# Patient Record
Sex: Male | Born: 1945 | Race: White | Hispanic: No | Smoking: Former smoker
Health system: Southern US, Community
[De-identification: ages and names within clinical notes are randomized; demographics above are authoritative.]

## PROBLEM LIST (undated history)

## (undated) DIAGNOSIS — M1990 Unspecified osteoarthritis, unspecified site: Secondary | ICD-10-CM

## (undated) DIAGNOSIS — E039 Hypothyroidism, unspecified: Secondary | ICD-10-CM

## (undated) DIAGNOSIS — E079 Disorder of thyroid, unspecified: Secondary | ICD-10-CM

## (undated) DIAGNOSIS — I10 Essential (primary) hypertension: Secondary | ICD-10-CM

## (undated) HISTORY — PX: TONSILLECTOMY: SUR1361

## (undated) HISTORY — PX: FEMUR FRACTURE SURGERY: SHX633

## (undated) HISTORY — PX: EYE SURGERY: SHX253

## (undated) HISTORY — PX: HERNIA REPAIR: SHX51

## (undated) HISTORY — PX: POLYPECTOMY: SHX149

## (undated) HISTORY — PX: OTHER SURGICAL HISTORY: SHX169

## (undated) SURGERY — COLONOSCOPY WITH PROPOFOL
Anesthesia: Monitor Anesthesia Care

---

## 2011-10-25 ENCOUNTER — Emergency Department (HOSPITAL_COMMUNITY)
Admission: EM | Admit: 2011-10-25 | Discharge: 2011-10-25 | Disposition: A | Discharge status: Home / Self Care | Payer: Non-veteran care | Attending: Emergency Medicine | Admitting: Emergency Medicine

## 2011-10-25 ENCOUNTER — Encounter (HOSPITAL_COMMUNITY): Payer: Self-pay | Admitting: *Deleted

## 2011-10-25 DIAGNOSIS — I10 Essential (primary) hypertension: Secondary | ICD-10-CM | POA: Insufficient documentation

## 2011-10-25 DIAGNOSIS — E119 Type 2 diabetes mellitus without complications: Secondary | ICD-10-CM | POA: Insufficient documentation

## 2011-10-25 DIAGNOSIS — Z79899 Other long term (current) drug therapy: Secondary | ICD-10-CM | POA: Insufficient documentation

## 2011-10-25 DIAGNOSIS — F419 Anxiety disorder, unspecified: Secondary | ICD-10-CM

## 2011-10-25 DIAGNOSIS — E079 Disorder of thyroid, unspecified: Secondary | ICD-10-CM | POA: Insufficient documentation

## 2011-10-25 DIAGNOSIS — T50995A Adverse effect of other drugs, medicaments and biological substances, initial encounter: Secondary | ICD-10-CM | POA: Insufficient documentation

## 2011-10-25 DIAGNOSIS — F411 Generalized anxiety disorder: Secondary | ICD-10-CM | POA: Insufficient documentation

## 2011-10-25 HISTORY — DX: Essential (primary) hypertension: I10

## 2011-10-25 HISTORY — DX: Disorder of thyroid, unspecified: E07.9

## 2011-10-25 MED ORDER — ALPRAZOLAM 1 MG PO TABS: ORAL_TABLET | ORAL | Status: DC

## 2011-10-25 MED ORDER — ALPRAZOLAM 0.5 MG PO TABS
1.0000 mg | ORAL_TABLET | Freq: Three times a day (TID) | ORAL | Status: DC | PRN
  Administered 2011-10-25: 1 mg via ORAL
  Filled 2011-10-25: qty 2

## 2011-10-25 NOTE — ED Notes (Signed)
Pt states that he has been putting cream and drops in his left eye and it makes him jittery and states that "i feel like i can't set still".

## 2011-10-25 NOTE — ED Provider Notes (Signed)
History     CSN: [id number]  Arrival date & time 10/25/11  1643   First MD Initiated Contact with Patient 10/25/11 1726      Chief Complaint  Patient presents with  . 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  He has been on ophthalmic steroid drops  Multiple times daily and notes that ~ 1/2 hr after insertion he begins feeling very anxious and panicky.  It resolves within an hour.  He was instructed to go to the [place] today but didn't think he could manage being in the car that long.  [facility] [place] [facility]  He wants to take his meds but is concerned about the way he feels when he does.  Patient is a 66 y.o. male presenting with allergic reaction. The history is provided by the patient. No language interpreter was used.  Allergic Reaction The problem has been resolved.  Significant symptoms that are not present include eye redness.    Past Medical History  Diagnosis Date  . Hypertension   . Diabetes mellitus   . Thyroid disease     Past Surgical History  Procedure Date  . Eye surgery   . Hernia repair   . Arm surgery   . Femur fracture surgery   . Polypectomy   . Tonsillectomy     History reviewed. No pertinent family history.  History  Substance Use Topics  . Smoking status: Former Games developer  . Smokeless tobacco: Not on file  . Alcohol Use: Yes      Review of Systems  Eyes: Negative for photophobia, pain, discharge, redness, itching and visual disturbance.  Psychiatric/Behavioral: Positive for behavioral problems and agitation. Negative for suicidal ideas and self-injury. The patient is nervous/anxious.   All other systems reviewed and are negative.    Allergies  Review of patient's allergies indicates no known allergies.  Home Medications   Current Outpatient Rx    Name Route Sig Dispense Refill  . ASPIRIN EC 325 MG PO TBEC Oral Take 325 mg by mouth every morning.    [name] Kitchen CARBOXYMETHYLCELLULOSE SODIUM 1 % OP SOLN Left Eye Place 1 drop into the left eye 6 (six) times daily.    [name] Kitchen VITAMIN D PO Oral Take 1 capsule by mouth every morning.    [name] Kitchen CIPROFLOXACIN HCL 0.3 % OP SOLN Left Eye Place 1 drop into the left eye every 4 (four) hours.    . DOCUSATE SODIUM 100 MG PO CAPS Oral Take 100 mg by mouth every morning.    [name] Kitchen FERROUS SULFATE 325 (65 FE) MG PO TABS Oral Take 325 mg by mouth daily with breakfast.    . LEVOTHYROXINE SODIUM 88 MCG PO TABS Oral Take 88 mcg by mouth daily.    [name] Kitchen LISINOPRIL 40 MG PO TABS Oral Take 20 mg by mouth 2 (two) times daily.    [name] Kitchen METFORMIN HCL 500 MG PO TABS Oral Take 500 mg by mouth 2 (two) times daily with a meal.    . NAPROXEN 500 MG PO TABS Oral Take 500 mg by mouth every morning.    [name] Kitchen OMEPRAZOLE 20 MG PO CPDR Oral Take 20 mg by mouth every evening.    [name] Kitchen PREDNISOLONE ACETATE 1 % OP SUSP Left Eye Place 1 drop into the left eye 4 (four) times daily.    [name] Kitchen SIMVASTATIN 40 MG PO  TABS Oral Take 20 mg by mouth every evening.    [name] Kitchen SODIUM CHLORIDE (HYPERTONIC) 5 % OP OINT Right Eye Place 1 drop into the right eye 2 (two) times daily.    . SODIUM CHLORIDE (HYPERTONIC) 5 % OP SOLN Right Eye Place 1 drop into the right eye 2 (two) times daily.    [name] Kitchen SPIRONOLACTONE 25 MG PO TABS Oral Take 25 mg by mouth every morning.    . TOBRAMYCIN-DEXAMETHASONE 0.3-0.1 % OP OINT Left Eye Place into the left eye 4 (four) times daily.    . TOBRAMYCIN-DEXAMETHASONE 0.3-0.1 % OP SUSP Left Eye Place 1 drop into the left eye 4 (four) times daily as needed.    [name] Kitchen TRAMADOL HCL 50 MG PO TABS Oral Take 100 mg by mouth daily. **Prescribed to take one tablet three times daily. Takes as needed**      BP 127/72  Pulse 62  Temp(Src) 97.4 F (36.3 C) (Oral)  Resp 18  Ht 5\' 11"  (1.803 m)  Wt 225 lb (102.059 kg)  BMI 31.38 kg/m2  SpO2 98%  Physical Exam  Nursing note  and vitals reviewed. Constitutional: He is oriented to person, place, and time. He appears well-developed and well-nourished.  HENT:  Head: Normocephalic and atraumatic.  Eyes: Conjunctivae, EOM and lids are normal. Pupils are equal, round, and reactive to light. Right eye exhibits no discharge and no exudate. Left eye exhibits no discharge and no exudate. Right conjunctiva is not injected. Left conjunctiva is not injected. No scleral icterus.  Neck: Normal range of motion.  Cardiovascular: Normal rate, regular rhythm, normal heart sounds and intact distal pulses.   Pulmonary/Chest: Effort normal and breath sounds normal. No respiratory distress.  Abdominal: Soft. He exhibits no distension. There is no tenderness.  Musculoskeletal: Normal range of motion.  Neurological: He is alert and oriented to person, place, and time. He has normal strength. No cranial nerve deficit or sensory deficit. He displays a negative Romberg sign. Coordination and gait normal. GCS eye subscore is 4. GCS verbal subscore is 5. GCS motor subscore is 6.  Skin: Skin is warm and dry.  Psychiatric: He has a normal mood and affect. His speech is normal and behavior is normal. Judgment and thought content normal. His mood appears not anxious. His affect is not angry, not blunt, not labile and not inappropriate. He is not agitated, not aggressive, is not hyperactive, not slowed, not withdrawn, not actively hallucinating and not combative. Thought content is not paranoid and not delusional. He does not exhibit a depressed mood. He expresses no homicidal and no suicidal ideation. He expresses no suicidal plans and no homicidal plans. He is attentive.    ED Course  Procedures (including critical care time)  Labs Reviewed - No data to display No results found.   No diagnosis found.    MDM          Worthy Rancher, PA 10/25/11 [phone]

## 2011-10-25 NOTE — ED Notes (Signed)
Pt appears to be anxious, states condition seems to worsen after taking steroid eye drops. See PAC notes.

## 2011-10-26 NOTE — ED Provider Notes (Signed)
Medical screening examination/treatment/procedure(s) were performed by non-physician practitioner and as supervising physician I was immediately available for consultation/collaboration.  Nicoletta Dress. Colon Branch, MD 10/26/11 [phone]

## 2019-06-24 ENCOUNTER — Encounter (INDEPENDENT_AMBULATORY_CARE_PROVIDER_SITE_OTHER): Payer: Self-pay | Admitting: Nurse Practitioner

## 2019-06-24 ENCOUNTER — Ambulatory Visit (INDEPENDENT_AMBULATORY_CARE_PROVIDER_SITE_OTHER): Payer: No Typology Code available for payment source | Admitting: Nurse Practitioner

## 2019-06-24 ENCOUNTER — Other Ambulatory Visit: Payer: Self-pay

## 2019-06-24 VITALS — BP 141/74 | HR 52 | Temp 97.9°F | Ht 71.0 in | Wt 219.1 lb

## 2019-06-24 DIAGNOSIS — K219 Gastro-esophageal reflux disease without esophagitis: Secondary | ICD-10-CM

## 2019-06-24 DIAGNOSIS — Z860100 Personal history of colon polyps, unspecified: Secondary | ICD-10-CM | POA: Insufficient documentation

## 2019-06-24 DIAGNOSIS — K625 Hemorrhage of anus and rectum: Secondary | ICD-10-CM | POA: Insufficient documentation

## 2019-06-24 DIAGNOSIS — R195 Other fecal abnormalities: Secondary | ICD-10-CM | POA: Diagnosis not present

## 2019-06-24 DIAGNOSIS — R001 Bradycardia, unspecified: Secondary | ICD-10-CM

## 2019-06-24 LAB — CBC WITH DIFFERENTIAL/PLATELET
Absolute Monocytes: 417 cells/uL (ref 200–950)
Basophils Absolute: 29 cells/uL (ref 0–200)
Basophils Relative: 0.6 %
Eosinophils Absolute: 196 cells/uL (ref 15–500)
Eosinophils Relative: 4 %
HCT: 38.1 % — ABNORMAL LOW (ref 38.5–50.0)
Hemoglobin: 12.3 g/dL — ABNORMAL LOW (ref 13.2–17.1)
Lymphs Abs: 1357 cells/uL (ref 850–3900)
MCH: 28.6 pg (ref 27.0–33.0)
MCHC: 32.3 g/dL (ref 32.0–36.0)
MCV: 88.6 fL (ref 80.0–100.0)
MPV: 10.9 fL (ref 7.5–12.5)
Monocytes Relative: 8.5 %
Neutro Abs: 2901 cells/uL (ref 1500–7800)
Neutrophils Relative %: 59.2 %
Platelets: 221 10*3/uL (ref 140–400)
RBC: 4.3 10*6/uL (ref 4.20–5.80)
RDW: 14.1 % (ref 11.0–15.0)
Total Lymphocyte: 27.7 %
WBC: 4.9 10*3/uL (ref 3.8–10.8)

## 2019-06-24 NOTE — Progress Notes (Addendum)
Subjective:    Patient ID: Mark Avila, male    DOB: 1946/04/11, 73 y.o.   MRN: [id number]  HPI Mark Avila is a 73 year old male with a past medical history of hypertension, asymptomatic bradycardia, hypothyroidism, diabetes mellitus type 2, chronic kidney disease stage III and GERD.  Past corneal transplant.  He presents today to schedule a colonoscopy after completing a fit test that was positive.  He reports having a colonoscopy at the [facility] June 2016, he stated 1 or 2 polyps were removed.  Family history is unknown as he was adopted.  He denies having any upper or lower abdominal pain.  He is passing normal formed bowel movement daily.  He occasionally sees bright red blood on the toilet tissue which he attributes to having hemorrhoids.  No melena.  He complains of having a rash described as cracked skin between the buttock folds which improves after using ketoconazole alternating with triamcinolone.  Reports having a anal fissure surgery more than 10 years ago.  He denies having any heartburn.  If he drinks cold water his mid esophagus locks up for a few seconds, otherwise no dysphasia.  He takes omeprazole 20 mg once daily.  He takes naproxen 500 mg once daily.  He is taking aspirin 325 mg once daily.  He underwent an EGD 07/27/2015  for dysphasia with liquids which she reported was normal.  He quit smoking at least 50 years ago.  He drinks 4 alcoholic beverages weekly or less.  He reports his heart rate is always low in the 40s to 50s.  His heart rate today is 52.  He is asymptomatic.  He denies having any history of heart disease.  He denies having any chest pain, palpitations or shortness of breath.  Labs 05/30/2019: Hemoglobin 12.6.  He takes ferrous sulfate 325 mg once daily.  Complete lab report was not provided.   Past Medical History:  Diagnosis Date  . Diabetes mellitus   . Hypertension   . Thyroid disease    Past Surgical History:  Procedure Laterality Date  .  arm surgery    . EYE SURGERY    . FEMUR FRACTURE SURGERY    . HERNIA REPAIR    . POLYPECTOMY    . TONSILLECTOMY     No Known Allergies Current Outpatient Medications on File Prior to Visit  Medication Sig Dispense Refill  . aspirin EC 325 MG tablet Take 325 mg by mouth every morning.    . carboxymethylcellulose 1 % ophthalmic solution Place 1 drop into the left eye 6 (six) times daily.    . Cholecalciferol (VITAMIN D PO) Take 1 capsule by mouth every morning.    . ferrous sulfate 325 (65 FE) MG tablet Take 325 mg by mouth daily with breakfast.    . levothyroxine (SYNTHROID, LEVOTHROID) 88 MCG tablet Take 88 mcg by mouth daily.    [name] Kitchen lisinopril (PRINIVIL,ZESTRIL) 40 MG tablet Take 20 mg by mouth 2 (two) times daily.    . metFORMIN (GLUCOPHAGE) 500 MG tablet Take 500 mg by mouth 2 (two) times daily with a meal.    . naproxen (NAPROSYN) 500 MG tablet Take 500 mg by mouth every morning.    [name] Kitchen omeprazole (PRILOSEC) 20 MG capsule Take 20 mg by mouth every evening.    . prednisoLONE acetate (PRED FORTE) 1 % ophthalmic suspension Place 1 drop into the left eye 4 (four) times daily.    . simvastatin (ZOCOR) 40 MG tablet Take 20  mg by mouth every evening.    [name] Kitchen spironolactone (ALDACTONE) 25 MG tablet Take 25 mg by mouth every morning.    . traMADol (ULTRAM) 50 MG tablet Take 100 mg by mouth daily. **Prescribed to take one tablet three times daily. Takes as needed**     No current facility-administered medications on file prior to visit.    Review of Systems see HPI, all other systems reviewed and are negative    Objective:   Physical Exam  BP (!) 141/74   Pulse (!) 52   Temp 97.9 F (36.6 C)   Ht 5\' 11"  (1.803 m)   Wt 219 lb 1.6 oz (99.4 kg)   BMI 30.56 kg/m   General: 73 year old male well-developed in no acute distress Eyes: Sclera nonicteric, conjunctiva pink Mouth: No ulcers or lesions Neck: Supple, no lymphadenopathy Heart: Regular rate and rhythm, no murmurs Lungs: Breath sounds  clear throughout Abdomen: Soft, nontender, no masses or organomegaly Rectal: Shiny pink derm between the gluteal folds with a linear fissure to this area without exudate, and internal rectal exam was done completed.  Patient to proceed with a colonoscopy for further rectal examination. Remedies: No edema Neuro: Alert and oriented x4, no focal deficits     Assessment & Plan:   51.  73 year old male with a positive fit test with rectal bleeding.  Hemoglobin 12.6 on 05/30/2019 -Colonoscopy benefits and risk discussed including risk with sedation, leading, perforation infection. EKG per anesthesia to be done at time of Pre-op exam -Repeat CBC today  2.  History of GERD and dysphagia with cold liquids most likely esophageal spasms.  Hemoglobin 12.6. -EGD at time of colonoscopy -Continue omeprazole 20 mg once daily for now  3.  History of iron deficiency anemia ? etiology.  Hemoglobin 12.6.  Patient is taking ferrous sulfate 325 mg once daily. -EGD, colonoscopy -Repeat CBC today  3.  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  History of asymptomatic bradycardia.  Heart rate 52. -EKG to be done by anesthesia at time of preop exam  Further follow-up to be determined after EGD and colonoscopy completed

## 2019-06-24 NOTE — Patient Instructions (Signed)
1. Schedule a colonoscopy  2. Complete the lab order today  3. Schedule a consult with a dermatologist regarding the rash between the buttock fold

## 2019-06-25 ENCOUNTER — Telehealth (INDEPENDENT_AMBULATORY_CARE_PROVIDER_SITE_OTHER): Payer: Self-pay | Admitting: Nurse Practitioner

## 2019-06-25 DIAGNOSIS — E039 Hypothyroidism, unspecified: Secondary | ICD-10-CM | POA: Insufficient documentation

## 2019-06-25 DIAGNOSIS — E119 Type 2 diabetes mellitus without complications: Secondary | ICD-10-CM | POA: Insufficient documentation

## 2019-06-25 DIAGNOSIS — R001 Bradycardia, unspecified: Secondary | ICD-10-CM | POA: Insufficient documentation

## 2019-06-25 NOTE — Telephone Encounter (Signed)
Ann, I added EGD to colonoscopy orders. Pt to also have EKG at time of Preop. Pt aware and he consents to EGD and colonoscopy.

## 2019-07-02 ENCOUNTER — Telehealth (INDEPENDENT_AMBULATORY_CARE_PROVIDER_SITE_OTHER): Payer: Self-pay | Admitting: *Deleted

## 2019-07-02 ENCOUNTER — Other Ambulatory Visit (INDEPENDENT_AMBULATORY_CARE_PROVIDER_SITE_OTHER): Payer: Self-pay | Admitting: *Deleted

## 2019-07-02 ENCOUNTER — Encounter (INDEPENDENT_AMBULATORY_CARE_PROVIDER_SITE_OTHER): Payer: Self-pay | Admitting: *Deleted

## 2019-07-02 DIAGNOSIS — Z86010 Personal history of colonic polyps: Secondary | ICD-10-CM

## 2019-07-02 DIAGNOSIS — K219 Gastro-esophageal reflux disease without esophagitis: Secondary | ICD-10-CM | POA: Insufficient documentation

## 2019-07-02 NOTE — Telephone Encounter (Signed)
Patient needs suprep TCS/EGD sch;d 12/4

## 2019-07-04 MED ORDER — SUPREP BOWEL PREP KIT 17.5-3.13-1.6 GM/177ML PO SOLN: 1.0000 | Freq: Once | ORAL | 0 refills | Status: AC

## 2019-07-23 ENCOUNTER — Ambulatory Visit (INDEPENDENT_AMBULATORY_CARE_PROVIDER_SITE_OTHER): Payer: Non-veteran care | Admitting: Nurse Practitioner

## 2019-08-19 ENCOUNTER — Encounter (HOSPITAL_COMMUNITY): Payer: Self-pay

## 2019-08-19 ENCOUNTER — Other Ambulatory Visit: Payer: Self-pay

## 2019-08-19 NOTE — Patient Instructions (Signed)
Mark Avila  08/19/2019     @PREFPERIOPPHARMACY @   Your procedure is scheduled on  08/22/2019   Report to [facility] at  [id number]  A.M.  Call this number if you have problems the morning of surgery:  [phone]   Remember:  Follow the diet and prep instructions given to you by Dr Olevia Perches office.                    Take these medicines the morning of surgery with A SIP OF WATER  Amlodipine, carvedilol, pepcid, gabapentin, levothyroxine, pantoprazole, tramadol(if needed).    Do not wear jewelry, make-up or nail polish.  Do not wear lotions, powders, or perfumes. Please wear deodorant and brush your teeth.  Do not shave 48 hours prior to surgery.  Men may shave face and neck.  Do not bring valuables to the hospital.  [facility] is not responsible for any belongings or valuables.  Contacts, dentures or bridgework may not be worn into surgery.  Leave your suitcase in the car.  After surgery it may be brought to your room.  For patients admitted to the hospital, discharge time will be determined by your treatment team.  Patients discharged the day of surgery will not be allowed to drive home.   Name and phone number of your driver:   family Special instructions:  None  Please read over the following fact sheets that you were given. Anesthesia Post-op Instructions and Care and Recovery After Surgery       Upper Endoscopy, Adult, Care After This sheet gives you information about how to care for yourself after your procedure. Your health care provider may also give you more specific instructions. If you have problems or questions, contact your health care provider. What can I expect after the procedure? After the procedure, it is common to have:  A sore throat.  Mild stomach pain or discomfort.  Bloating.  Nausea. Follow these instructions at home:   Follow instructions from your health care provider about what to eat or drink after your procedure.  Return  to your normal activities as told by your health care provider. Ask your health care provider what activities are safe for you.  Take over-the-counter and prescription medicines only as told by your health care provider.  Do not drive for 24 hours if you were given a sedative during your procedure.  Keep all follow-up visits as told by your health care provider. This is important. Contact a health care provider if you have:  A sore throat that lasts longer than one day.  Trouble swallowing. Get help right away if:  You vomit blood or your vomit looks like coffee grounds.  You have: ? A fever. ? Bloody, black, or tarry stools. ? A severe sore throat or you cannot swallow. ? Difficulty breathing. ? Severe pain in your chest or abdomen. Summary  After the procedure, it is common to have a sore throat, mild stomach discomfort, bloating, and nausea.  Do not drive for 24 hours if you were given a sedative during the procedure.  Follow instructions from your health care provider about what to eat or drink after your procedure.  Return to your normal activities as told by your health care provider. This information is not intended to replace advice given to you by your health care provider. Make sure you discuss any questions you have with your health care provider. Document Released: 03/05/2012 Document  Revised: 02/26/2018 Document Reviewed: 02/04/2018 Elsevier Patient Education  [place].  Colonoscopy, Adult, Care After This sheet gives you information about how to care for yourself after your procedure. Your health care provider may also give you more specific instructions. If you have problems or questions, contact your health care provider. What can I expect after the procedure? After the procedure, it is common to have:  A small amount of blood in your stool for 24 hours after the procedure.  Some gas.  Mild abdominal cramping or bloating. Follow these  instructions at home: General instructions  For the first 24 hours after the procedure: ? Do not drive or use machinery. ? Do not sign important documents. ? Do not drink alcohol. ? Do your regular daily activities at a slower pace than normal. ? Eat soft, easy-to-digest foods.  Take over-the-counter or prescription medicines only as told by your health care provider. Relieving cramping and bloating   Try walking around when you have cramps or feel bloated.  Apply heat to your abdomen as told by your health care provider. Use a heat source that your health care provider recommends, such as a moist heat pack or a heating pad. ? Place a towel between your skin and the heat source. ? Leave the heat on for 20-30 minutes. ? Remove the heat if your skin turns bright red. This is especially important if you are unable to feel pain, heat, or cold. You may have a greater risk of getting burned. Eating and drinking   Drink enough fluid to keep your urine pale yellow.  Resume your normal diet as instructed by your health care provider. Avoid heavy or fried foods that are hard to digest.  Avoid drinking alcohol for as long as instructed by your health care provider. Contact a health care provider if:  You have blood in your stool 2-3 days after the procedure. Get help right away if:  You have more than a small spotting of blood in your stool.  You pass large blood clots in your stool.  Your abdomen is swollen.  You have nausea or vomiting.  You have a fever.  You have increasing abdominal pain that is not relieved with medicine. Summary  After the procedure, it is common to have a small amount of blood in your stool. You may also have mild abdominal cramping and bloating.  For the first 24 hours after the procedure, do not drive or use machinery, sign important documents, or drink alcohol.  Contact your health care provider if you have a lot of blood in your stool, nausea or  vomiting, a fever, or increased abdominal pain. This information is not intended to replace advice given to you by your health care provider. Make sure you discuss any questions you have with your health care provider. Document Released: 04/18/2004 Document Revised: 06/27/2017 Document Reviewed: 11/16/2015 Elsevier Patient Education  2020 [place] After These instructions provide you with information about caring for yourself after your procedure. Your health care provider may also give you more specific instructions. Your treatment has been planned according to current medical practices, but problems sometimes occur. Call your health care provider if you have any problems or questions after your procedure. What can I expect after the procedure? After your procedure, you may:  Feel sleepy for several hours.  Feel clumsy and have poor balance for several hours.  Feel forgetful about what happened after the procedure.  Have poor judgment  for several hours.  Feel nauseous or vomit.  Have a sore throat if you had a breathing tube during the procedure. Follow these instructions at home: For at least 24 hours after the procedure:      Have a responsible adult stay with you. It is important to have someone help care for you until you are awake and alert.  Rest as needed.  Do not: ? Participate in activities in which you could fall or become injured. ? Drive. ? Use heavy machinery. ? Drink alcohol. ? Take sleeping pills or medicines that cause drowsiness. ? Make important decisions or sign legal documents. ? Take care of children on your own. Eating and drinking  Follow the diet that is recommended by your health care provider.  If you vomit, drink water, juice, or soup when you can drink without vomiting.  Make sure you have little or no nausea before eating solid foods. General instructions  Take over-the-counter and prescription  medicines only as told by your health care provider.  If you have sleep apnea, surgery and certain medicines can increase your risk for breathing problems. Follow instructions from your health care provider about wearing your sleep device: ? Anytime you are sleeping, including during daytime naps. ? While taking prescription pain medicines, sleeping medicines, or medicines that make you drowsy.  If you smoke, do not smoke without supervision.  Keep all follow-up visits as told by your health care provider. This is important. Contact a health care provider if:  You keep feeling nauseous or you keep vomiting.  You feel light-headed.  You develop a rash.  You have a fever. Get help right away if:  You have trouble breathing. Summary  For several hours after your procedure, you may feel sleepy and have poor judgment.  Have a responsible adult stay with you for at least 24 hours or until you are awake and alert. This information is not intended to replace advice given to you by your health care provider. Make sure you discuss any questions you have with your health care provider. Document Released: 12/26/2015 Document Revised: 12/03/2017 Document Reviewed: 12/26/2015 Elsevier Patient Education  2020 [employer].

## 2019-08-20 ENCOUNTER — Other Ambulatory Visit: Payer: Self-pay

## 2019-08-20 ENCOUNTER — Other Ambulatory Visit (HOSPITAL_COMMUNITY)
Admission: RE | Admit: 2019-08-20 | Discharge: 2019-08-20 | Disposition: A | Discharge status: Home / Self Care | Payer: Medicare Other | Source: Ambulatory Visit | Attending: Internal Medicine | Admitting: Internal Medicine

## 2019-08-20 ENCOUNTER — Encounter (HOSPITAL_COMMUNITY)
Admission: RE | Admit: 2019-08-20 | Discharge: 2019-08-20 | Disposition: A | Discharge status: Home / Self Care | Payer: Non-veteran care | Source: Ambulatory Visit | Attending: Internal Medicine | Admitting: Internal Medicine

## 2019-08-20 DIAGNOSIS — Z01812 Encounter for preprocedural laboratory examination: Secondary | ICD-10-CM | POA: Insufficient documentation

## 2019-08-20 DIAGNOSIS — Z20828 Contact with and (suspected) exposure to other viral communicable diseases: Secondary | ICD-10-CM | POA: Insufficient documentation

## 2019-08-20 HISTORY — DX: Unspecified osteoarthritis, unspecified site: M19.90

## 2019-08-20 HISTORY — DX: Hypothyroidism, unspecified: E03.9

## 2019-08-20 LAB — SARS CORONAVIRUS 2 (TAT 6-24 HRS): SARS Coronavirus 2: NEGATIVE

## 2019-08-22 ENCOUNTER — Encounter (HOSPITAL_COMMUNITY)
Admission: RE | Disposition: A | Discharge status: Home / Self Care | Payer: Self-pay | Source: Home / Self Care | Attending: Internal Medicine

## 2019-08-22 ENCOUNTER — Other Ambulatory Visit: Payer: Self-pay

## 2019-08-22 ENCOUNTER — Ambulatory Visit (HOSPITAL_COMMUNITY): Payer: No Typology Code available for payment source | Admitting: Anesthesiology

## 2019-08-22 ENCOUNTER — Ambulatory Visit (HOSPITAL_COMMUNITY)
Admission: RE | Admit: 2019-08-22 | Discharge: 2019-08-22 | Disposition: A | Discharge status: Home / Self Care | Payer: No Typology Code available for payment source | Attending: Internal Medicine | Admitting: Internal Medicine

## 2019-08-22 ENCOUNTER — Encounter (HOSPITAL_COMMUNITY): Payer: Self-pay

## 2019-08-22 DIAGNOSIS — Z79899 Other long term (current) drug therapy: Secondary | ICD-10-CM | POA: Diagnosis not present

## 2019-08-22 DIAGNOSIS — E039 Hypothyroidism, unspecified: Secondary | ICD-10-CM | POA: Insufficient documentation

## 2019-08-22 DIAGNOSIS — R1314 Dysphagia, pharyngoesophageal phase: Secondary | ICD-10-CM | POA: Insufficient documentation

## 2019-08-22 DIAGNOSIS — Z87891 Personal history of nicotine dependence: Secondary | ICD-10-CM | POA: Diagnosis not present

## 2019-08-22 DIAGNOSIS — K644 Residual hemorrhoidal skin tags: Secondary | ICD-10-CM | POA: Diagnosis not present

## 2019-08-22 DIAGNOSIS — E119 Type 2 diabetes mellitus without complications: Secondary | ICD-10-CM | POA: Insufficient documentation

## 2019-08-22 DIAGNOSIS — D123 Benign neoplasm of transverse colon: Secondary | ICD-10-CM | POA: Insufficient documentation

## 2019-08-22 DIAGNOSIS — Z7982 Long term (current) use of aspirin: Secondary | ICD-10-CM | POA: Insufficient documentation

## 2019-08-22 DIAGNOSIS — Z7984 Long term (current) use of oral hypoglycemic drugs: Secondary | ICD-10-CM | POA: Diagnosis not present

## 2019-08-22 DIAGNOSIS — K219 Gastro-esophageal reflux disease without esophagitis: Secondary | ICD-10-CM | POA: Diagnosis not present

## 2019-08-22 DIAGNOSIS — K228 Other specified diseases of esophagus: Secondary | ICD-10-CM | POA: Diagnosis not present

## 2019-08-22 DIAGNOSIS — Z86010 Personal history of colonic polyps: Secondary | ICD-10-CM | POA: Diagnosis not present

## 2019-08-22 DIAGNOSIS — K625 Hemorrhage of anus and rectum: Secondary | ICD-10-CM | POA: Diagnosis not present

## 2019-08-22 DIAGNOSIS — Z79890 Hormone replacement therapy (postmenopausal): Secondary | ICD-10-CM | POA: Diagnosis not present

## 2019-08-22 DIAGNOSIS — Z7952 Long term (current) use of systemic steroids: Secondary | ICD-10-CM | POA: Diagnosis not present

## 2019-08-22 DIAGNOSIS — R195 Other fecal abnormalities: Secondary | ICD-10-CM | POA: Diagnosis present

## 2019-08-22 DIAGNOSIS — D125 Benign neoplasm of sigmoid colon: Secondary | ICD-10-CM | POA: Diagnosis not present

## 2019-08-22 DIAGNOSIS — M1990 Unspecified osteoarthritis, unspecified site: Secondary | ICD-10-CM | POA: Insufficient documentation

## 2019-08-22 DIAGNOSIS — I10 Essential (primary) hypertension: Secondary | ICD-10-CM | POA: Insufficient documentation

## 2019-08-22 HISTORY — PX: BIOPSY: SHX5522

## 2019-08-22 HISTORY — PX: COLONOSCOPY WITH PROPOFOL: SHX5780

## 2019-08-22 HISTORY — PX: POLYPECTOMY: SHX5525

## 2019-08-22 HISTORY — PX: ESOPHAGOGASTRODUODENOSCOPY (EGD) WITH PROPOFOL: SHX5813

## 2019-08-22 HISTORY — PX: ESOPHAGEAL DILATION: SHX303

## 2019-08-22 LAB — HEMOGLOBIN AND HEMATOCRIT, BLOOD
HCT: 40.3 % (ref 39.0–52.0)
Hemoglobin: 12.8 g/dL — ABNORMAL LOW (ref 13.0–17.0)

## 2019-08-22 LAB — GLUCOSE, CAPILLARY: Glucose-Capillary: 138 mg/dL — ABNORMAL HIGH (ref 70–99)

## 2019-08-22 SURGERY — ESOPHAGOGASTRODUODENOSCOPY (EGD) WITH PROPOFOL
Anesthesia: General

## 2019-08-22 MED ORDER — PROPOFOL 500 MG/50ML IV EMUL
INTRAVENOUS | Status: DC | PRN
  Administered 2019-08-22: 125 ug/kg/min via INTRAVENOUS

## 2019-08-22 MED ORDER — GLYCOPYRROLATE PF 0.2 MG/ML IJ SOSY
PREFILLED_SYRINGE | INTRAMUSCULAR | Status: AC
  Filled 2019-08-22: qty 1

## 2019-08-22 MED ORDER — KETAMINE HCL 10 MG/ML IJ SOLN
INTRAMUSCULAR | Status: DC | PRN
  Administered 2019-08-22: 30 mg via INTRAVENOUS

## 2019-08-22 MED ORDER — KETAMINE HCL 50 MG/5ML IJ SOSY
PREFILLED_SYRINGE | INTRAMUSCULAR | Status: AC
  Filled 2019-08-22: qty 5

## 2019-08-22 MED ORDER — CHLORHEXIDINE GLUCONATE CLOTH 2 % EX PADS: 6.0000 | MEDICATED_PAD | Freq: Once | CUTANEOUS | Status: DC

## 2019-08-22 MED ORDER — HYDROMORPHONE HCL 1 MG/ML IJ SOLN: 0.2500 mg | INTRAMUSCULAR | Status: DC | PRN

## 2019-08-22 MED ORDER — LIDOCAINE HCL (CARDIAC) PF 100 MG/5ML IV SOSY
PREFILLED_SYRINGE | INTRAVENOUS | Status: DC | PRN
  Administered 2019-08-22: 50 mg via INTRATRACHEAL

## 2019-08-22 MED ORDER — HYDROCODONE-ACETAMINOPHEN 7.5-325 MG PO TABS: 1.0000 | ORAL_TABLET | Freq: Once | ORAL | Status: DC | PRN

## 2019-08-22 MED ORDER — LIDOCAINE 2% (20 MG/ML) 5 ML SYRINGE
INTRAMUSCULAR | Status: AC
  Filled 2019-08-22: qty 5

## 2019-08-22 MED ORDER — GLYCOPYRROLATE 0.2 MG/ML IJ SOLN
INTRAMUSCULAR | Status: DC | PRN
  Administered 2019-08-22: 0.1 mg via INTRAVENOUS

## 2019-08-22 MED ORDER — MIDAZOLAM HCL 2 MG/2ML IJ SOLN: 0.5000 mg | Freq: Once | INTRAMUSCULAR | Status: DC | PRN

## 2019-08-22 MED ORDER — PROMETHAZINE HCL 25 MG/ML IJ SOLN: 6.2500 mg | INTRAMUSCULAR | Status: DC | PRN

## 2019-08-22 MED ORDER — LACTATED RINGERS IV SOLN
INTRAVENOUS | Status: DC
  Administered 2019-08-22: 09:00:00 via INTRAVENOUS

## 2019-08-22 MED ORDER — PROPOFOL 10 MG/ML IV BOLUS
INTRAVENOUS | Status: DC | PRN
  Administered 2019-08-22: 30 mg via INTRAVENOUS

## 2019-08-22 NOTE — Op Note (Signed)
[facility] Patient Name: Mark Avila Procedure Date: 08/22/2019 9:27 AM MRN: [id number] Date of Birth: Sep 06, 1946 Attending MD: Hildred Laser , MD CSN: [id number] Age: 73 Admit Type: Outpatient Procedure:                Upper GI endoscopy Indications:              Esophageal dysphagia Providers:                Hildred Laser, MD, Hinton Rao, RN, Raphael Gibney, Technician Referring MD:             [facility]. Medicines:                Propofol per Anesthesia Complications:            No immediate complications. Estimated Blood Loss:     Estimated blood loss was minimal. Procedure:                Pre-Anesthesia Assessment:                           - Prior to the procedure, a History and Physical                            was performed, and patient medications and                            allergies were reviewed. The patient's tolerance of                            previous anesthesia was also reviewed. The risks                            and benefits of the procedure and the sedation                            options and risks were discussed with the patient.                            All questions were answered, and informed consent                            was obtained. Prior Anticoagulants: The patient has                            taken no previous anticoagulant or antiplatelet                            agents except for aspirin. ASA Grade Assessment: II                            - A patient with mild systemic disease. After  reviewing the risks and benefits, the patient was                            deemed in satisfactory condition to undergo the                            procedure.                           After obtaining informed consent, the endoscope was                            passed under direct vision. Throughout the                            procedure, the patient's blood pressure, pulse, and                             oxygen saturations were monitored continuously. The                            [id number]) scope was introduced through the                            mouth, and advanced to the second part of duodenum.                            The upper GI endoscopy was accomplished without                            difficulty. The patient tolerated the procedure                            well. Scope In: 9:55:29 AM Scope Out: 10:07:13 AM Total Procedure Duration: 0 hours 11 minutes 44 seconds  Findings:      The proximal esophagus, mid esophagus and distal esophagus were normal       except singlre patch of salmon colored mucosa about 5 x 12 mm.      No endoscopic abnormality was evident in the esophagus to explain the       patient's complaint of dysphagia. It was decided, however, to proceed       with dilation of the entire esophagus. Esophagus dilated by passing 10       Fr. Maloney dilator. The dilation site was examined following endoscope       reinsertion and showed no change and no bleeding, mucosal tear or       perforation. Biopsies were taken with a cold forceps for histology. The       pathology specimen was placed into Bottle Number 1.      The Z-line was regular and was found 42 cm from the incisors.      The entire examined stomach was normal.      The duodenal bulb and second portion of the duodenum were normal. Impression:               - Normal proximal esophagus, mid esophagus and  distal esophagus except single patch with salmon                            colored mucosa.Biopsied.                           - No endoscopic esophageal abnormality to explain                            patient's dysphagia. Esophagus dilated.                           - Z-line regular, 42 cm from the incisors.                           - Normal stomach.                           - Normal duodenal bulb and second portion of the                             duodenum. Moderate Sedation:      Per Anesthesia Care Recommendation:           - Patient has a contact number available for                            emergencies. The signs and symptoms of potential                            delayed complications were discussed with the                            patient. Return to normal activities tomorrow.                            Written discharge instructions were provided to the                            patient.                           - Resume previous diet today.                           - Continue present medications.                           - No aspirin, ibuprofen, naproxen, or other                            non-steroidal anti-inflammatory drugs for 2 days.                           - Await pathology results. Procedure Code(s):        --- Professional ---  [id number], Esophagogastroduodenoscopy, flexible,                            transoral; with biopsy, single or multiple Diagnosis Code(s):        --- Professional ---                           R13.14, Dysphagia, pharyngoesophageal phase CPT copyright 2019 American Medical Association. All rights reserved. The codes documented in this report are preliminary and upon coder review may  be revised to meet current compliance requirements. Hildred Laser, MD Hildred Laser, MD 08/22/2019 10:52:33 AM This report has been signed electronically. Number of Addenda: 0

## 2019-08-22 NOTE — Transfer of Care (Signed)
Immediate Anesthesia Transfer of Care Note  Patient: Mark Avila  Procedure(s) Performed: ESOPHAGOGASTRODUODENOSCOPY (EGD) WITH PROPOFOL (N/A ) COLONOSCOPY WITH PROPOFOL (N/A ) ESOPHAGEAL DILATION POLYPECTOMY  Patient Location: PACU  Anesthesia Type:General  Level of Consciousness: awake  Airway & Oxygen Therapy: Patient Spontanous Breathing  Post-op Assessment: Report given to RN and Post -op Vital signs reviewed and stable  Post vital signs: Reviewed and stable  Last Vitals:  Vitals Value Taken Time  BP    Temp    Pulse 49 08/22/19 1047  Resp    SpO2 100 % 08/22/19 1047  Vitals shown include unvalidated device data.  Last Pain:  Vitals:   08/22/19 0915  TempSrc: Oral  PainSc: 0-No pain      Patients Stated Pain Goal: 6 ([id number])  Complications: No apparent anesthesia complications

## 2019-08-22 NOTE — Anesthesia Preprocedure Evaluation (Signed)
Anesthesia Evaluation  Patient identified by MRN, date of birth, ID band Patient awake    Reviewed: Allergy & Precautions, NPO status , Patient's Chart, lab work & pertinent test results, reviewed documented beta blocker date and time   Airway Mallampati: II  TM Distance: >3 FB Neck ROM: Full    Dental no notable dental hx. (+) Missing   Pulmonary neg pulmonary ROS, former smoker,    Pulmonary exam normal breath sounds clear to auscultation       Cardiovascular Exercise Tolerance: Good hypertension, Pt. on medications and Pt. 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   Neuro/Psych negative neurological ROS  negative psych ROS   GI/Hepatic Neg liver ROS, GERD  Medicated and Controlled,  Endo/Other  diabetes, Type 2, Oral Hypoglycemic AgentsHypothyroidism   Renal/GU negative Renal ROS  negative genitourinary   Musculoskeletal  (+) Arthritis , Osteoarthritis,    Abdominal   Peds negative pediatric ROS (+)  Hematology negative hematology ROS (+)   Anesthesia Other Findings   Reproductive/Obstetrics negative OB ROS                             Anesthesia Physical Anesthesia Plan  ASA: II  Anesthesia Plan: General   Post-op Pain Management:    Induction: Intravenous  PONV Risk Score and Plan: 2 and Propofol infusion, TIVA and Treatment may vary due to age or medical condition  Airway Management Planned: Nasal Cannula and Simple Face Mask  Additional Equipment:   Intra-op Plan:   Post-operative Plan:   Informed Consent: I have reviewed the patients History and Physical, chart, labs and discussed the procedure including the risks, benefits and alternatives for the proposed anesthesia with the patient or authorized representative who has indicated his/her understanding and acceptance.     Dental  advisory given  Plan Discussed with: CRNA  Anesthesia Plan Comments: (Plan Full PPE use  Plan GA with GETA as needed d/w pt -WTP with same after Q&A)        Anesthesia Quick Evaluation

## 2019-08-22 NOTE — Discharge Instructions (Signed)
Resume aspirin on 08/24/2019. Resume other medications as before.   Resume usual diet. No driving for 24 hours. Physician will call with biopsy results.   Upper Endoscopy, Adult, Care After This sheet gives you information about how to care for yourself after your procedure. Your health care provider may also give you more specific instructions. If you have problems or questions, contact your health care provider. What can I expect after the procedure? After the procedure, it is common to have:  A sore throat.  Mild stomach pain or discomfort.  Bloating.  Nausea. Follow these instructions at home:   Follow instructions from your health care provider about what to eat or drink after your procedure.  Return to your normal activities as told by your health care provider. Ask your health care provider what activities are safe for you.  Take over-the-counter and prescription medicines only as told by your health care provider.  Do not drive for 24 hours if you were given a sedative during your procedure.  Keep all follow-up visits as told by your health care provider. This is important. Contact a health care provider if you have:  A sore throat that lasts longer than one day.  Trouble swallowing. Get help right away if:  You vomit blood or your vomit looks like coffee grounds.  You have: ? A fever. ? Bloody, black, or tarry stools. ? A severe sore throat or you cannot swallow. ? Difficulty breathing. ? Severe pain in your chest or abdomen. Summary  After the procedure, it is common to have a sore throat, mild stomach discomfort, bloating, and nausea.  Do not drive for 24 hours if you were given a sedative during the procedure.  Follow instructions from your health care provider about what to eat or drink after your procedure.  Return to your normal activities as told by your health care provider. This information is not intended to replace advice given to you by your  health care provider. Make sure you discuss any questions you have with your health care provider. Document Released: 03/05/2012 Document Revised: 02/26/2018 Document Reviewed: 02/04/2018 Elsevier Patient Education  [place].   Colon Polyps  Polyps are tissue growths inside the body. Polyps can grow in many places, including the large intestine (colon). A polyp may be a round bump or a mushroom-shaped growth. You could have one polyp or several. Most colon polyps are noncancerous (benign). However, some colon polyps can become cancerous over time. Finding and removing the polyps early can help prevent this. What are the causes? The exact cause of colon polyps is not known. What increases the risk? You are more likely to develop this condition if you:  Have a family history of colon cancer or colon polyps.  Are older than 64 or older than 45 if you are African American.  Have inflammatory bowel disease, such as ulcerative colitis or Crohn's disease.  Have certain hereditary conditions, such as: ? Familial adenomatous polyposis. ? Lynch syndrome. ? Turcot syndrome. ? Peutz-Jeghers syndrome.  Are overweight.  Smoke cigarettes.  Do not get enough exercise.  Drink too much alcohol.  Eat a diet that is high in fat and red meat and low in fiber.  Had childhood cancer that was treated with abdominal radiation. What are the signs or symptoms? Most polyps do not cause symptoms. If you have symptoms, they may include:  Blood coming from your rectum when having a bowel movement.  Blood in your stool. The stool may  look dark red or black.  Abdominal pain.  A change in bowel habits, such as constipation or diarrhea. How is this diagnosed? This condition is diagnosed with a colonoscopy. This is a procedure in which a lighted, flexible scope is inserted into the anus and then passed into the colon to examine the area. Polyps are sometimes found when a colonoscopy is done as  part of routine cancer screening tests. How is this treated? Treatment for this condition involves removing any polyps that are found. Most polyps can be removed during a colonoscopy. Those polyps will then be tested for cancer. Additional treatment may be needed depending on the results of testing. Follow these instructions at home: Lifestyle  Maintain a healthy weight, or lose weight if recommended by your health care provider.  Exercise every day or as told by your health care provider.  Do not use any products that contain nicotine or tobacco, such as cigarettes and e-cigarettes. If you need help quitting, ask your health care provider.  If you drink alcohol, limit how much you have: ? 0-1 drink a day for women. ? 0-2 drinks a day for men.  Be aware of how much alcohol is in your drink. In the [place] one drink equals one 12 oz bottle of beer (355 mL), one 5 oz glass of wine (148 mL), or one 1 oz shot of hard liquor (44 mL). Eating and drinking   Eat foods that are high in fiber, such as fruits, vegetables, and whole grains.  Eat foods that are high in calcium and vitamin D, such as milk, cheese, yogurt, eggs, liver, fish, and broccoli.  Limit foods that are high in fat, such as fried foods and desserts.  Limit the amount of red meat and processed meat you eat, such as hot dogs, sausage, bacon, and lunch meats. General instructions  Keep all follow-up visits as told by your health care provider. This is important. ? This includes having regularly scheduled colonoscopies. ? Talk to your health care provider about when you need a colonoscopy. Contact a health care provider if:  You have new or worsening bleeding during a bowel movement.  You have new or increased blood in your stool.  You have a change in bowel habits.  You lose weight for no known reason. Summary  Polyps are tissue growths inside the body. Polyps can grow in many places, including the colon.  Most colon  polyps are noncancerous (benign), but some can become cancerous over time.  This condition is diagnosed with a colonoscopy.  Treatment for this condition involves removing any polyps that are found. Most polyps can be removed during a colonoscopy. This information is not intended to replace advice given to you by your health care provider. Make sure you discuss any questions you have with your health care provider. Document Released: 05/31/2004 Document Revised: 12/20/2017 Document Reviewed: 12/20/2017 Elsevier Patient Education  [place].  Colonoscopy, Adult, Care After This sheet gives you information about how to care for yourself after your procedure. Your doctor may also give you more specific instructions. If you have problems or questions, call your doctor. What can I expect after the procedure? After the procedure, it is common to have:  A small amount of blood in your poop for 24 hours.  Some gas.  Mild cramping or bloating in your belly. Follow these instructions at home: General instructions  For the first 24 hours after the procedure: ? Do not drive or use machinery. ?  Do not sign important documents. ? Do not drink alcohol. ? Do your daily activities more slowly than normal. ? Eat foods that are soft and easy to digest.  Take over-the-counter or prescription medicines only as told by your doctor. To help cramping and bloating:   Try walking around.  Put heat on your belly (abdomen) as told by your doctor. Use a heat source that your doctor recommends, such as a moist heat pack or a heating pad. ? Put a towel between your skin and the heat source. ? Leave the heat on for 20-30 minutes. ? Remove the heat if your skin turns bright red. This is especially important if you cannot feel pain, heat, or cold. You can get burned. Eating and drinking   Drink enough fluid to keep your pee (urine) clear or pale yellow.  Return to your normal diet as told by your  doctor. Avoid heavy or fried foods that are hard to digest.  Avoid drinking alcohol for as long as told by your doctor. Contact a doctor if:  You have blood in your poop (stool) 2-3 days after the procedure. Get help right away if:  You have more than a small amount of blood in your poop.  You see large clumps of tissue (blood clots) in your poop.  Your belly is swollen.  You feel sick to your stomach (nauseous).  You throw up (vomit).  You have a fever.  You have belly pain that gets worse, and medicine does not help your pain. Summary  After the procedure, it is common to have a small amount of blood in your poop. You may also have mild cramping and bloating in your belly.  For the first 24 hours after the procedure, do not drive or use machinery, do not sign important documents, and do not drink alcohol.  Get help right away if you have a lot of blood in your poop, feel sick to your stomach, have a fever, or have more belly pain. This information is not intended to replace advice given to you by your health care provider. Make sure you discuss any questions you have with your health care provider. Document Released: 10/07/2010 Document Revised: 07/05/2017 Document Reviewed: 05/29/2016 Elsevier Patient Education  2020 [place] After These instructions provide you with information about caring for yourself after your procedure. Your health care provider may also give you more specific instructions. Your treatment has been planned according to current medical practices, but problems sometimes occur. Call your health care provider if you have any problems or questions after your procedure. What can I expect after the procedure? After your procedure, you may:  Feel sleepy for several hours.  Feel clumsy and have poor balance for several hours.  Feel forgetful about what happened after the procedure.  Have poor judgment for several  hours.  Feel nauseous or vomit.  Have a sore throat if you had a breathing tube during the procedure. Follow these instructions at home: For at least 24 hours after the procedure:      Have a responsible adult stay with you. It is important to have someone help care for you until you are awake and alert.  Rest as needed.  Do not: ? Participate in activities in which you could fall or become injured. ? Drive. ? Use heavy machinery. ? Drink alcohol. ? Take sleeping pills or medicines that cause drowsiness. ? Make important decisions or sign legal documents. ? Take care  of children on your own. Eating and drinking  Follow the diet that is recommended by your health care provider.  If you vomit, drink water, juice, or soup when you can drink without vomiting.  Make sure you have little or no nausea before eating solid foods. General instructions  Take over-the-counter and prescription medicines only as told by your health care provider.  If you have sleep apnea, surgery and certain medicines can increase your risk for breathing problems. Follow instructions from your health care provider about wearing your sleep device: ? Anytime you are sleeping, including during daytime naps. ? While taking prescription pain medicines, sleeping medicines, or medicines that make you drowsy.  If you smoke, do not smoke without supervision.  Keep all follow-up visits as told by your health care provider. This is important. Contact a health care provider if:  You keep feeling nauseous or you keep vomiting.  You feel light-headed.  You develop a rash.  You have a fever. Get help right away if:  You have trouble breathing. Summary  For several hours after your procedure, you may feel sleepy and have poor judgment.  Have a responsible adult stay with you for at least 24 hours or until you are awake and alert. This information is not intended to replace advice given to you by your  health care provider. Make sure you discuss any questions you have with your health care provider. Document Released: 12/26/2015 Document Revised: 12/03/2017 Document Reviewed: 12/26/2015 Elsevier Patient Education  [place].  Colonoscopy, Adult, Care After This sheet gives you information about how to care for yourself after your procedure. Your doctor may also give you more specific instructions. If you have problems or questions, call your doctor. What can I expect after the procedure? After the procedure, it is common to have:  A small amount of blood in your poop for 24 hours.  Some gas.  Mild cramping or bloating in your belly. Follow these instructions at home: General instructions  For the first 24 hours after the procedure: ? Do not drive or use machinery. ? Do not sign important documents. ? Do not drink alcohol. ? Do your daily activities more slowly than normal. ? Eat foods that are soft and easy to digest.  Take over-the-counter or prescription medicines only as told by your doctor. To help cramping and bloating:   Try walking around.  Put heat on your belly (abdomen) as told by your doctor. Use a heat source that your doctor recommends, such as a moist heat pack or a heating pad. ? Put a towel between your skin and the heat source. ? Leave the heat on for 20-30 minutes. ? Remove the heat if your skin turns bright red. This is especially important if you cannot feel pain, heat, or cold. You can get burned. Eating and drinking   Drink enough fluid to keep your pee (urine) clear or pale yellow.  Return to your normal diet as told by your doctor. Avoid heavy or fried foods that are hard to digest.  Avoid drinking alcohol for as long as told by your doctor. Contact a doctor if:  You have blood in your poop (stool) 2-3 days after the procedure. Get help right away if:  You have more than a small amount of blood in your poop.  You see large clumps of  tissue (blood clots) in your poop.  Your belly is swollen.  You feel sick to your stomach (nauseous).  You throw  up (vomit).  You have a fever.  You have belly pain that gets worse, and medicine does not help your pain. Summary  After the procedure, it is common to have a small amount of blood in your poop. You may also have mild cramping and bloating in your belly.  For the first 24 hours after the procedure, do not drive or use machinery, do not sign important documents, and do not drink alcohol.  Get help right away if you have a lot of blood in your poop, feel sick to your stomach, have a fever, or have more belly pain. This information is not intended to replace advice given to you by your health care provider. Make sure you discuss any questions you have with your health care provider. Document Released: 10/07/2010 Document Revised: 07/05/2017 Document Reviewed: 05/29/2016 Elsevier Patient Education  2020 [employer].

## 2019-08-22 NOTE — Op Note (Signed)
[facility] Patient Name: Draken Mckiney Procedure Date: 08/22/2019 10:10 AM MRN: [id number] Date of Birth: 26-Feb-1946 Attending MD: Hildred Laser , MD CSN: [id number] Age: 73 Admit Type: Outpatient Procedure:                Colonoscopy Indications:              Hematochezia, Positive fecal immunochemical test Providers:                Hildred Laser, MD, Hinton Rao, RN, Raphael Gibney, Technician Referring MD:             [facility] Medicines:                Propofol per Anesthesia Complications:            No immediate complications. Estimated Blood Loss:     Estimated blood loss was minimal. Procedure:                Pre-Anesthesia Assessment:                           - Prior to the procedure, a History and Physical                            was performed, and patient medications and                            allergies were reviewed. The patient's tolerance of                            previous anesthesia was also reviewed. The risks                            and benefits of the procedure and the sedation                            options and risks were discussed with the patient.                            All questions were answered, and informed consent                            was obtained. Prior Anticoagulants: The patient has                            taken no previous anticoagulant or antiplatelet                            agents except for aspirin. ASA Grade Assessment: II                            - A patient with mild systemic disease. After  reviewing the risks and benefits, the patient was                            deemed in satisfactory condition to undergo the                            procedure.                           - Prior to the procedure, a History and Physical                            was performed, and patient medications and                            allergies were reviewed. The  patient's tolerance of                            previous anesthesia was also reviewed. The risks                            and benefits of the procedure and the sedation                            options and risks were discussed with the patient.                            All questions were answered, and informed consent                            was obtained. ASA Grade Assessment: II - A patient                            with mild systemic disease. After reviewing the                            risks and benefits, the patient was deemed in                            satisfactory condition to undergo the procedure.                           After obtaining informed consent, the colonoscope                            was passed under direct vision. Throughout the                            procedure, the patient's blood pressure, pulse, and                            oxygen saturations were monitored continuously. The  [id number] scope was introduced through                            the anus and advanced to the the cecum, identified                            by appendiceal orifice and ileocecal valve. The                            colonoscopy was performed without difficulty. The                            patient tolerated the procedure well. The quality                            of the bowel preparation was good. The ileocecal                            valve, appendiceal orifice, and rectum were                            photographed. Scope In: 10:12:22 AM Scope Out: 10:36:43 AM Scope Withdrawal Time: 0 hours 17 minutes 14 seconds  Total Procedure Duration: 0 hours 24 minutes 21 seconds  Findings:      The perianal and digital rectal examinations were normal.      Two sessile polyps were found in the transverse colon. The polyps were       small in size. These polyps were removed with a cold snare. Resection       was complete, but the  polyp tissue was only partially retrieved. The       pathology specimen was placed into Bottle Number 2.      Two polyps were found in the sigmoid colon and transverse colon. The       polyps were small in size. These were biopsied with a cold forceps for       histology. The pathology specimen was placed into Bottle Number 2.      The exam was otherwise normal throughout the examined colon.      External hemorrhoids were found during retroflexion. The hemorrhoids       were medium-sized. Impression:               - Two small polyps in the transverse colon, removed                            with a cold snare. Complete resection. Partial                            retrieval.                           - Two small polyps in the sigmoid colon and in the                            transverse colon. Biopsied.                           -  External hemorrhoids. Moderate Sedation:      Per Anesthesia Care Recommendation:           - Patient has a contact number available for                            emergencies. The signs and symptoms of potential                            delayed complications were discussed with the                            patient. Return to normal activities tomorrow.                            Written discharge instructions were provided to the                            patient.                           - Resume previous diet today.                           - No aspirin, ibuprofen, naproxen, or other                            non-steroidal anti-inflammatory drugs for 1 day.                           - Await pathology results.                           - Repeat colonoscopy is recommended. The                            colonoscopy date will be determined after pathology                            results from today's exam become available for                            review. Procedure Code(s):        --- Professional ---                           [phone],  Colonoscopy, flexible; with removal of                            tumor(s), polyp(s), or other lesion(s) by snare                            technique                           [id number], 31, Colonoscopy, flexible; with biopsy,  single or multiple Diagnosis Code(s):        --- Professional ---                           K63.5, Polyp of colon                           K64.4, Residual hemorrhoidal skin tags                           K92.1, Melena (includes Hematochezia)                           R19.5, Other fecal abnormalities CPT copyright 2019 American [employer] Association. All rights reserved. The codes documented in this report are preliminary and upon coder review may  be revised to meet current compliance requirements. Hildred Laser, MD Hildred Laser, MD 08/22/2019 10:58:04 AM This report has been signed electronically. Number of Addenda: 0

## 2019-08-22 NOTE — Anesthesia Postprocedure Evaluation (Signed)
Anesthesia Post Note  Patient: Mark Avila  Procedure(s) Performed: ESOPHAGOGASTRODUODENOSCOPY (EGD) WITH PROPOFOL (N/A ) COLONOSCOPY WITH PROPOFOL (N/A ) ESOPHAGEAL DILATION POLYPECTOMY  Patient location during evaluation: PACU Anesthesia Type: General Level of consciousness: awake and alert Pain management: pain level controlled Vital Signs Assessment: post-procedure vital signs reviewed and stable Respiratory status: spontaneous breathing, nonlabored ventilation and respiratory function stable Cardiovascular status: stable Postop Assessment: no apparent nausea or vomiting Anesthetic complications: no     Last Vitals:  Vitals:   08/22/19 0915  BP: (!) 158/78  Pulse: (!) 57  Resp: 16  Temp: 36.7 C  SpO2: 100%    Last Pain:  Vitals:   08/22/19 0915  TempSrc: Oral  PainSc: 0-No pain                 Linford Quintela Hristova

## 2019-08-22 NOTE — H&P (Signed)
Mark Avila is an 73 y.o. male.   Chief Complaint: Patient is here for esophagogastroduodenoscopy with esophageal dilation and colonoscopy. HPI: Patient is 73 year old Caucasian male who presents with several month history of intermittent dysphagia which is primarily with liquids and more often with cold liquids.  He points to mid sternal area site of bolus obstruction.  He says liquid bolus usually goes down but sometimes he has regurgitation.  He does not have any difficulty with solids.  He denies frequent heartburn.  No nausea vomiting or melena.  His last colonoscopy was over 4 years ago with removal of 1 or 2 polyps.  He had stool FIT and it was positive.  He was therefore advised diagnostic colonoscopy.  He says he has hemorrhoidal bleeding once in a while.  He believes when the stool test was done he had black this morning but they still decided to proceed with the test. Last aspirin dose was 3 days ago. Family history is not available as he was adopted when he was 38 days old. His hemoglobin from 8 weeks ago was 12.3 g.  It will be rechecked today to make sure that it is stable and/or improved.  Past Medical History:  Diagnosis Date  . Arthritis   . Diabetes mellitus   . Hypertension   . Hypothyroidism   . Thyroid disease     Past Surgical History:  Procedure Laterality Date  . arm surgery Left   . EYE SURGERY    . FEMUR FRACTURE SURGERY Right   . HERNIA REPAIR    . POLYPECTOMY    . TONSILLECTOMY      History reviewed. No pertinent family history. Social History:  reports that he quit smoking about 49 years ago. His smoking use included cigarettes. He has a 20.00 pack-year smoking history. He has never used smokeless tobacco. He reports current alcohol use. He reports that he does not use drugs.  Allergies: No Known Allergies  Medications Prior to Admission  Medication Sig Dispense Refill  . Alogliptin Benzoate 25 MG TABS Take 25 mg by mouth daily.    [name] Kitchen amLODipine (NORVASC)  10 MG tablet Take 10 mg by mouth daily.    [name] Kitchen aspirin EC 325 MG tablet Take 325 mg by mouth every morning.    . carboxymethylcellulose 1 % ophthalmic solution Place 1 drop into both eyes every evening.     . carvedilol (COREG) 6.25 MG tablet Take 3.125 mg by mouth 2 (two) times daily with a meal.    . Cholecalciferol (VITAMIN D PO) Take 5,000 Units by mouth daily.     . famotidine (PEPCID) 20 MG tablet Take 20 mg by mouth 2 (two) times daily.    [name] Kitchen gabapentin (NEURONTIN) 400 MG capsule Take 400 mg by mouth 3 (three) times daily as needed (neuropathy).    [name] Kitchen glipiZIDE (GLUCOTROL) 5 MG tablet Take 5 mg by mouth 2 (two) times daily before a meal.    . hydrochlorothiazide (HYDRODIURIL) 25 MG tablet Take 12.5 mg by mouth daily.    . hydrocortisone 2.5 % cream Apply 1 application topically 2 (two) times daily.    [name] Kitchen ketoconazole (NIZORAL) 2 % cream Apply 1 application topically daily as needed for irritation.    [name] Kitchen levothyroxine (SYNTHROID, LEVOTHROID) 88 MCG tablet Take 88 mcg by mouth daily before breakfast.     . magnesium oxide (MAG-OX) 400 MG tablet Take 400 mg by mouth daily.    . metFORMIN (GLUCOPHAGE) 1000 MG tablet Take 1,000 mg by  mouth 2 (two) times daily with a meal.     . Multiple Vitamins-Minerals (PRESERVISION AREDS 2+MULTI VIT PO) Take 1 tablet by mouth 2 (two) times daily.    . pantoprazole (PROTONIX) 40 MG tablet Take 40 mg by mouth 2 (two) times daily.    . prednisoLONE acetate (PRED FORTE) 1 % ophthalmic suspension Place 1 drop into both eyes daily.     . simvastatin (ZOCOR) 40 MG tablet Take 20 mg by mouth every evening.    [name] Kitchen spironolactone (ALDACTONE) 25 MG tablet Take 25 mg by mouth every morning.    . traMADol (ULTRAM) 50 MG tablet Take 100 mg by mouth 2 (two) times daily.     [name] Kitchen triamcinolone cream (KENALOG) 0.1 % Apply 1 application topically 2 (two) times daily.      Results for orders placed or performed during the hospital encounter of 08/22/19 (from the past 48 hour(s))   Glucose, capillary     Status: Abnormal   Collection Time: 08/22/19  9:27 AM  Result Value Ref Range   Glucose-Capillary 138 (H) 70 - 99 mg/dL   No results found.  ROS  Blood pressure (!) 158/78, pulse (!) 57, temperature 98.1 F (36.7 C), temperature source Oral, resp. rate 16, SpO2 100 %. Physical Exam  Constitutional: He appears well-developed and well-nourished.  HENT:  Mouth/Throat: Oropharynx is clear and moist.  Eyes: Conjunctivae are normal. No scleral icterus.  Neck: No thyromegaly present.  Cardiovascular: Normal rate, regular rhythm and normal heart sounds.  No murmur heard. Respiratory: Effort normal and breath sounds normal.  GI:  Abdomen is full but soft and nontender with organomegaly or masses.  Musculoskeletal:        General: No edema.  Lymphadenopathy:    He has no cervical adenopathy.  Neurological: He is alert.  Skin: Skin is warm and dry.     Assessment/Plan Esophageal dysphagia primarily to liquids. Positive stool FIT test. Hematochezia. Diagnostic esophagogastroduodenoscopy with possible esophageal dilation. Diagnostic colonoscopy.  Hildred Laser, MD 08/22/2019, 9:41 AM

## 2019-08-25 LAB — SURGICAL PATHOLOGY

## 2019-08-26 ENCOUNTER — Encounter (HOSPITAL_COMMUNITY): Payer: Self-pay | Admitting: Internal Medicine

## 2019-11-01 ENCOUNTER — Other Ambulatory Visit: Payer: Self-pay

## 2019-11-01 ENCOUNTER — Ambulatory Visit: Payer: Medicare Other | Attending: Internal Medicine

## 2019-11-01 DIAGNOSIS — Z23 Encounter for immunization: Secondary | ICD-10-CM

## 2019-11-01 NOTE — Progress Notes (Signed)
   Covid-19 Vaccination Clinic  Name:  Mark Avila    MRN: [id number] DOB: 1946/05/17  11/01/2019  Mark Avila was observed post Covid-19 immunization for 15 minutes without incidence. He was provided with Vaccine Information Sheet and instruction to access the V-Safe system.   Mark Avila was instructed to call 911 with any severe reactions post vaccine: [name] Kitchen Difficulty breathing  . Swelling of your face and throat  . A fast heartbeat  . A bad rash all over your body  . Dizziness and weakness    Immunizations Administered    Name Date Dose VIS Date Route   Moderna COVID-19 Vaccine 11/01/2019  2:09 PM 0.5 mL 08/19/2019 Intramuscular   Manufacturer: Moderna   Lot: [id number]   [id number]

## 2019-11-29 ENCOUNTER — Ambulatory Visit: Payer: Non-veteran care | Attending: Internal Medicine

## 2019-11-29 DIAGNOSIS — Z23 Encounter for immunization: Secondary | ICD-10-CM

## 2019-11-29 NOTE — Progress Notes (Signed)
   Covid-19 Vaccination Clinic  Name:  Emmaus Kovacic    MRN: [id number] DOB: March 03, 1946  11/29/2019  Mr. Kise was observed post Covid-19 immunization for 15 minutes without incident. He was provided with Vaccine Information Sheet and instruction to access the V-Safe system.   Mr. Campese was instructed to call 911 with any severe reactions post vaccine: [name] Kitchen Difficulty breathing  . Swelling of face and throat  . A fast heartbeat  . A bad rash all over body  . Dizziness and weakness   Immunizations Administered    Name Date Dose VIS Date Route   Pfizer COVID-19 Vaccine 11/29/2019 11:46 AM 0.3 mL 08/29/2019 Intramuscular   Manufacturer: [place]   Lot: [id number]   [place]: [id number]

## 2020-09-23 ENCOUNTER — Other Ambulatory Visit (HOSPITAL_COMMUNITY): Payer: Self-pay | Admitting: Internal Medicine

## 2020-09-23 DIAGNOSIS — M5416 Radiculopathy, lumbar region: Secondary | ICD-10-CM

## 2020-10-06 ENCOUNTER — Other Ambulatory Visit: Payer: Self-pay

## 2020-10-06 ENCOUNTER — Ambulatory Visit (HOSPITAL_COMMUNITY)
Admission: RE | Admit: 2020-10-06 | Discharge: 2020-10-06 | Disposition: A | Discharge status: Home / Self Care | Payer: No Typology Code available for payment source | Source: Ambulatory Visit | Attending: Internal Medicine | Admitting: Internal Medicine

## 2020-10-06 DIAGNOSIS — M48061 Spinal stenosis, lumbar region without neurogenic claudication: Secondary | ICD-10-CM | POA: Diagnosis not present

## 2020-10-06 DIAGNOSIS — M4726 Other spondylosis with radiculopathy, lumbar region: Secondary | ICD-10-CM | POA: Diagnosis not present

## 2020-10-06 DIAGNOSIS — M5416 Radiculopathy, lumbar region: Secondary | ICD-10-CM | POA: Diagnosis present

## 2022-02-04 ENCOUNTER — Emergency Department (HOSPITAL_COMMUNITY)
Admission: EM | Admit: 2022-02-04 | Discharge: 2022-02-04 | Disposition: A | Discharge status: Home / Self Care | Payer: No Typology Code available for payment source | Attending: Emergency Medicine | Admitting: Emergency Medicine

## 2022-02-04 ENCOUNTER — Other Ambulatory Visit: Payer: Self-pay

## 2022-02-04 ENCOUNTER — Encounter (HOSPITAL_COMMUNITY): Payer: Self-pay

## 2022-02-04 DIAGNOSIS — Z7982 Long term (current) use of aspirin: Secondary | ICD-10-CM | POA: Insufficient documentation

## 2022-02-04 DIAGNOSIS — R7989 Other specified abnormal findings of blood chemistry: Secondary | ICD-10-CM | POA: Diagnosis present

## 2022-02-04 DIAGNOSIS — R001 Bradycardia, unspecified: Secondary | ICD-10-CM | POA: Diagnosis not present

## 2022-02-04 LAB — I-STAT CHEM 8, ED
BUN: 24 mg/dL — ABNORMAL HIGH (ref 8–23)
Calcium, Ion: 1.13 mmol/L — ABNORMAL LOW (ref 1.15–1.40)
Chloride: 101 mmol/L (ref 98–111)
Creatinine, Ser: 1.3 mg/dL — ABNORMAL HIGH (ref 0.61–1.24)
Glucose, Bld: 141 mg/dL — ABNORMAL HIGH (ref 70–99)
HCT: 42 % (ref 39.0–52.0)
Hemoglobin: 14.3 g/dL (ref 13.0–17.0)
Potassium: 4.4 mmol/L (ref 3.5–5.1)
Sodium: 139 mmol/L (ref 135–145)
TCO2: 27 mmol/L (ref 22–32)

## 2022-02-04 NOTE — ED Provider Notes (Signed)
[facility] EMERGENCY DEPARTMENT Provider Note   CSN: [id number] Arrival date & time: 02/04/22  8185     History  Chief Complaint  Patient presents with   Abnormal Labs    Mark Avila is a 76 y.o. male.  HPI 76 yo male presents with complaints of report of abnormal labs.  Patient seen at [facility] yesterday.  Called and told to come to ED due to hyperkalemia and abnormal creatinine      Home Medications Prior to Admission medications   Medication Sig Start Date End Date Taking? Authorizing Provider  Alogliptin Benzoate 25 MG TABS Take 25 mg by mouth daily.    [provider]  amLODipine (NORVASC) 10 MG tablet Take 10 mg by mouth daily.    [provider]  aspirin EC 325 MG tablet Take 1 tablet (325 mg total) by mouth every morning. 08/24/19   Rehman, Mechele Dawley, MD  carboxymethylcellulose 1 % ophthalmic solution Place 1 drop into both eyes every evening.     [provider]  carvedilol (COREG) 6.25 MG tablet Take 3.125 mg by mouth 2 (two) times daily with a meal.    [provider]  Cholecalciferol (VITAMIN D PO) Take 5,000 Units by mouth daily.     [provider]  famotidine (PEPCID) 20 MG tablet Take 20 mg by mouth 2 (two) times daily.    [provider]  gabapentin (NEURONTIN) 400 MG capsule Take 400 mg by mouth 3 (three) times daily as needed (neuropathy).    [provider]  glipiZIDE (GLUCOTROL) 5 MG tablet Take 5 mg by mouth 2 (two) times daily before a meal.    [provider]  hydrochlorothiazide (HYDRODIURIL) 25 MG tablet Take 12.5 mg by mouth daily.    [provider]  hydrocortisone 2.5 % cream Apply 1 application topically 2 (two) times daily.    [provider]  ketoconazole (NIZORAL) 2 % cream Apply 1 application topically daily as needed for irritation.    [provider]  levothyroxine (SYNTHROID, LEVOTHROID) 88 MCG tablet Take 88 mcg by mouth daily before breakfast.      [provider]  magnesium oxide (MAG-OX) 400 MG tablet Take 400 mg by mouth daily.    [provider]  metFORMIN (GLUCOPHAGE) 1000 MG tablet Take 1,000 mg by mouth 2 (two) times daily with a meal.     [provider]  Multiple Vitamins-Minerals (PRESERVISION AREDS 2+MULTI VIT PO) Take 1 tablet by mouth 2 (two) times daily.    [provider]  pantoprazole (PROTONIX) 40 MG tablet Take 40 mg by mouth 2 (two) times daily.    [provider]  prednisoLONE acetate (PRED FORTE) 1 % ophthalmic suspension Place 1 drop into both eyes daily.     [provider]  simvastatin (ZOCOR) 40 MG tablet Take 20 mg by mouth every evening.    [provider]  spironolactone (ALDACTONE) 25 MG tablet Take 25 mg by mouth every morning.    [provider]  traMADol (ULTRAM) 50 MG tablet Take 100 mg by mouth 2 (two) times daily.     [provider]  triamcinolone cream (KENALOG) 0.1 % Apply 1 application topically 2 (two) times daily.    [provider]      Allergies    Patient has no known allergies.    Review of Systems   Review of Systems  All other systems reviewed and are negative.  Physical Exam Updated Vital Signs  BP 129/66   Pulse (!) 45   Temp 97.7 F (36.5 C) (Oral)   Resp 11   Ht 1.803 m ('5\' 11"'$ )   Wt 97.5 kg   SpO2 97%   BMI 29.99 kg/m  Physical Exam Vitals and nursing note reviewed.  HENT:     Head: Normocephalic.     Right Ear: External ear normal.     Left Ear: External ear normal.     Nose: Nose normal.     Mouth/Throat:     Mouth: Mucous membranes are moist.  Cardiovascular:     Rate and Rhythm: Normal rate.  Pulmonary:     Effort: Pulmonary effort is normal.  Abdominal:     General: Abdomen is flat.  Musculoskeletal:     Cervical back: Normal range of motion.  Skin:    General: Skin is warm.     Capillary Refill: Capillary refill takes less than 2 seconds.  Neurological:      General: No focal deficit present.     Mental Status: He is alert.  Psychiatric:        Mood and Affect: Mood normal.    ED Results / Procedures / Treatments   Labs (all labs ordered are listed, but only abnormal results are displayed) Labs Reviewed  I-STAT CHEM 8, ED - Abnormal; Notable for the following components:      Result Value   BUN 24 (*)    Creatinine, Ser 1.30 (*)    Glucose, Bld 141 (*)    Calcium, Ion 1.13 (*)    All other components within normal limits    EKG EKG Interpretation  Date/Time:  Saturday Feb 04 2022 09:00:36 EDT Ventricular Rate:  54 PR Interval:    QRS Duration: 100 QT Interval:  395 QTC Calculation: 375 R Axis:   -48 Text Interpretation: Unclear rhythm LAD, consider left anterior fascicular block bradycardia No old tracing to compare Confirmed by Pattricia Boss [phone]) on 02/04/2022 10:00:20 AM  Radiology No results found.  Procedures Procedures    Medications Ordered in ED Medications - No data to display  ED Course/ Medical Decision Making/ A&P Clinical Course as of 02/06/22 1404  Sat Feb 04, 2022  0940 Potassium normal on review and interpreation of labs Patient with creatinine elevated at 1.3- baseline not available in chl [DR]    Clinical Course User Index [DR] Pattricia Boss, MD                           Medical Decision Making Patient with report of elevated potassium Patient had labs drawn at [facility] yesterday-potassium 6.1 Bun28 Creatinine 1.4 Patient here for recheck DDX  Hemolyzed labs vs volume depletion, new renal failure, medication effect     Returned and potassium is 4.4, I suspect yesterday labs were hemolyzed Creatinine continues elevated at 1.3.  I have no baseline to check against. Patient is advised to call his doctors on Monday and have this checked against his baseline he may need redraw next week EKG was obtained to evaluate for hyperkalemia.  He patient is noted to be in A-fib with a slow rate.  Patient  has not report any known history of atrial fibrillation.  However, he does state that his rate has continuously been low.  Review of medicines include beta-blocker.  He is not on any blood thinners at ICU. Patient was advised to call his doctor to get recheck on Monday to be reevaluated for his  creatinine and repeat EKG to be compared with old We have discussed return precautions and need for follow-up he voiced understanding.       Final Clinical Impression(s) / ED Diagnoses Final diagnoses:  Elevated serum creatinine  Bradycardia    Rx / DC Orders ED Discharge Orders     None         Pattricia Boss, MD 02/06/22 1404

## 2022-02-04 NOTE — ED Triage Notes (Signed)
Patient went for a routine checkup at the [place] and had labwork done that resulted with a potassium of 6.1. Patient denies CP or any symptoms.

## 2022-02-04 NOTE — Discharge Instructions (Signed)
Please call your doctors at the [place] first thing on Monday morning. They can compare your previous kidney function to the kidney function that was obtained here today.  Here today your creatinine was 1.3 Your potassium was normal at 4.1 Your heart rate is slow on your EKG.  However, you need to have an EKG done them to be compared to old EKGs. Please return if you are having worsening symptoms at any time.

## 2022-04-19 ENCOUNTER — Emergency Department (HOSPITAL_COMMUNITY): Payer: No Typology Code available for payment source

## 2022-04-19 ENCOUNTER — Encounter (HOSPITAL_COMMUNITY): Payer: Self-pay | Admitting: *Deleted

## 2022-04-19 ENCOUNTER — Other Ambulatory Visit: Payer: Self-pay

## 2022-04-19 ENCOUNTER — Emergency Department (HOSPITAL_COMMUNITY)
Admission: EM | Admit: 2022-04-19 | Discharge: 2022-04-19 | Disposition: A | Discharge status: Home / Self Care | Payer: No Typology Code available for payment source | Attending: Student | Admitting: Student

## 2022-04-19 DIAGNOSIS — Z79899 Other long term (current) drug therapy: Secondary | ICD-10-CM | POA: Diagnosis not present

## 2022-04-19 DIAGNOSIS — E119 Type 2 diabetes mellitus without complications: Secondary | ICD-10-CM | POA: Insufficient documentation

## 2022-04-19 DIAGNOSIS — M5442 Lumbago with sciatica, left side: Secondary | ICD-10-CM | POA: Diagnosis present

## 2022-04-19 DIAGNOSIS — M25552 Pain in left hip: Secondary | ICD-10-CM | POA: Diagnosis not present

## 2022-04-19 DIAGNOSIS — Z7984 Long term (current) use of oral hypoglycemic drugs: Secondary | ICD-10-CM | POA: Diagnosis not present

## 2022-04-19 DIAGNOSIS — R944 Abnormal results of kidney function studies: Secondary | ICD-10-CM | POA: Diagnosis not present

## 2022-04-19 DIAGNOSIS — I10 Essential (primary) hypertension: Secondary | ICD-10-CM | POA: Diagnosis not present

## 2022-04-19 DIAGNOSIS — Z7982 Long term (current) use of aspirin: Secondary | ICD-10-CM | POA: Diagnosis not present

## 2022-04-19 DIAGNOSIS — E039 Hypothyroidism, unspecified: Secondary | ICD-10-CM | POA: Diagnosis not present

## 2022-04-19 DIAGNOSIS — R1032 Left lower quadrant pain: Secondary | ICD-10-CM | POA: Insufficient documentation

## 2022-04-19 LAB — CBC WITH DIFFERENTIAL/PLATELET
Abs Immature Granulocytes: 0.01 10*3/uL (ref 0.00–0.07)
Basophils Absolute: 0 10*3/uL (ref 0.0–0.1)
Basophils Relative: 0 %
Eosinophils Absolute: 0.2 10*3/uL (ref 0.0–0.5)
Eosinophils Relative: 4 %
HCT: 38.4 % — ABNORMAL LOW (ref 39.0–52.0)
Hemoglobin: 12.4 g/dL — ABNORMAL LOW (ref 13.0–17.0)
Immature Granulocytes: 0 %
Lymphocytes Relative: 31 %
Lymphs Abs: 1.4 10*3/uL (ref 0.7–4.0)
MCH: 29 pg (ref 26.0–34.0)
MCHC: 32.3 g/dL (ref 30.0–36.0)
MCV: 89.9 fL (ref 80.0–100.0)
Monocytes Absolute: 0.4 10*3/uL (ref 0.1–1.0)
Monocytes Relative: 9 %
Neutro Abs: 2.4 10*3/uL (ref 1.7–7.7)
Neutrophils Relative %: 56 %
Platelets: 187 10*3/uL (ref 150–400)
RBC: 4.27 MIL/uL (ref 4.22–5.81)
RDW: 14.3 % (ref 11.5–15.5)
WBC: 4.5 10*3/uL (ref 4.0–10.5)
nRBC: 0 % (ref 0.0–0.2)

## 2022-04-19 LAB — COMPREHENSIVE METABOLIC PANEL
ALT: 15 U/L (ref 0–44)
AST: 18 U/L (ref 15–41)
Albumin: 3.6 g/dL (ref 3.5–5.0)
Alkaline Phosphatase: 60 U/L (ref 38–126)
Anion gap: 7 (ref 5–15)
BUN: 30 mg/dL — ABNORMAL HIGH (ref 8–23)
CO2: 28 mmol/L (ref 22–32)
Calcium: 8.7 mg/dL — ABNORMAL LOW (ref 8.9–10.3)
Chloride: 101 mmol/L (ref 98–111)
Creatinine, Ser: 1.44 mg/dL — ABNORMAL HIGH (ref 0.61–1.24)
GFR, Estimated: 51 mL/min — ABNORMAL LOW (ref >60)
Glucose, Bld: 174 mg/dL — ABNORMAL HIGH (ref 70–99)
Potassium: 3.9 mmol/L (ref 3.5–5.1)
Sodium: 136 mmol/L (ref 135–145)
Total Bilirubin: 0.6 mg/dL (ref 0.3–1.2)
Total Protein: 6.3 g/dL — ABNORMAL LOW (ref 6.5–8.1)

## 2022-04-19 LAB — URINALYSIS, ROUTINE W REFLEX MICROSCOPIC
Bilirubin Urine: NEGATIVE
Glucose, UA: NEGATIVE mg/dL
Hgb urine dipstick: NEGATIVE
Ketones, ur: NEGATIVE mg/dL
Nitrite: NEGATIVE
Protein, ur: NEGATIVE mg/dL
Specific Gravity, Urine: 1.024 (ref 1.005–1.030)
pH: 5 (ref 5.0–8.0)

## 2022-04-19 MED ORDER — METHYLPREDNISOLONE 4 MG PO TBPK: ORAL_TABLET | ORAL | 0 refills | Status: AC

## 2022-04-19 MED ORDER — LIDOCAINE 5 % EX PTCH
1.0000 | MEDICATED_PATCH | CUTANEOUS | 0 refills | Status: AC
Start: 2022-04-19 — End: ?

## 2022-04-19 NOTE — Discharge Instructions (Signed)
Wear the lidocaine patches to your back as directed.  Take the prednisone as directed until finished.  You may call the neurosurgery office listed to arrange a follow-up appointment if needed.

## 2022-04-19 NOTE — ED Notes (Signed)
Ambulatory without difficulty. No signs of distress.

## 2022-04-19 NOTE — ED Notes (Signed)
Returned from MRI. No signs of distress

## 2022-04-19 NOTE — ED Notes (Signed)
Patient transported to CT 

## 2022-04-19 NOTE — ED Provider Notes (Signed)
[facility] EMERGENCY DEPARTMENT Provider Note   CSN: [id number] Arrival date & time: 04/19/22  1248     History  Chief Complaint  Patient presents with   Back Pain    Mark Avila is a 76 y.o. male.   Back Pain Associated symptoms: no chest pain, no dysuria, no fever, no headaches, no numbness and no weakness         Mark Avila is a 76 y.o. male with past medical history of hypertension, type 2 diabetes, arthritis, and hypothyroidism who presents to the Emergency Department complaining of lower back and left hip pain x2 weeks.  Pain is gradually worsened since onset.  No known injury.  He states he has had pain of his lower back and hip in the past, had MRI of his lumbar spine in January 2022 showed some "arthritis" but states he was not a surgical candidate.  He has shooting sharp pains that radiate into his left leg to the level of his knee.  He also reports having some pain of his left lower abdomen, but this is only associated with movement.  He denies pain into his groin or genital region.  The sharp pains of his leg are intermittent.  His pain is exacerbated by movement and resolves when supine.  He typically takes tramadol 50 mg twice a day with improvement of his pain.  He denies any numbness or weakness of his lower extremities, urine or bowel changes, nausea or vomiting, fever or chills.   Home Medications Prior to Admission medications   Medication Sig Start Date End Date Taking? Authorizing Provider  Alogliptin Benzoate 25 MG TABS Take 25 mg by mouth daily.    [provider]  amLODipine (NORVASC) 10 MG tablet Take 10 mg by mouth daily.    [provider]  aspirin EC 325 MG tablet Take 1 tablet (325 mg total) by mouth every morning. 08/24/19   Rehman, Mechele Dawley, MD  carboxymethylcellulose 1 % ophthalmic solution Place 1 drop into both eyes every evening.     [provider]  carvedilol (COREG) 6.25 MG tablet Take 3.125 mg by mouth 2 (two) times  daily with a meal.    [provider]  Cholecalciferol (VITAMIN D PO) Take 5,000 Units by mouth daily.     [provider]  famotidine (PEPCID) 20 MG tablet Take 20 mg by mouth 2 (two) times daily.    [provider]  gabapentin (NEURONTIN) 400 MG capsule Take 400 mg by mouth 3 (three) times daily as needed (neuropathy).    [provider]  glipiZIDE (GLUCOTROL) 5 MG tablet Take 5 mg by mouth 2 (two) times daily before a meal.    [provider]  hydrochlorothiazide (HYDRODIURIL) 25 MG tablet Take 12.5 mg by mouth daily.    [provider]  hydrocortisone 2.5 % cream Apply 1 application topically 2 (two) times daily.    [provider]  ketoconazole (NIZORAL) 2 % cream Apply 1 application topically daily as needed for irritation.    [provider]  levothyroxine (SYNTHROID, LEVOTHROID) 88 MCG tablet Take 88 mcg by mouth daily before breakfast.     [provider]  magnesium oxide (MAG-OX) 400 MG tablet Take 400 mg by mouth daily.    [provider]  metFORMIN (GLUCOPHAGE) 1000 MG tablet Take 1,000 mg by mouth 2 (two) times daily with a meal.     [provider]  Multiple Vitamins-Minerals (PRESERVISION AREDS 2+MULTI VIT PO) Take  1 tablet by mouth 2 (two) times daily.    [provider]  pantoprazole (PROTONIX) 40 MG tablet Take 40 mg by mouth 2 (two) times daily.    [provider]  prednisoLONE acetate (PRED FORTE) 1 % ophthalmic suspension Place 1 drop into both eyes daily.     [provider]  simvastatin (ZOCOR) 40 MG tablet Take 20 mg by mouth every evening.    [provider]  spironolactone (ALDACTONE) 25 MG tablet Take 25 mg by mouth every morning.    [provider]  traMADol (ULTRAM) 50 MG tablet Take 100 mg by mouth 2 (two) times daily.     [provider]  triamcinolone cream (KENALOG) 0.1 % Apply 1 application topically 2 (two) times  daily.    [provider]      Allergies    Patient has no known allergies.    Review of Systems   Review of Systems  Constitutional:  Negative for appetite change, chills and fever.  Respiratory:  Negative for cough and shortness of breath.   Cardiovascular:  Negative for chest pain and leg swelling.  Gastrointestinal:  Negative for diarrhea, nausea and vomiting.  Genitourinary:  Negative for decreased urine volume, difficulty urinating, dysuria, flank pain, hematuria, penile pain, penile swelling, scrotal swelling, testicular pain and urgency.  Musculoskeletal:  Positive for back pain.  Skin:  Negative for color change and rash.  Neurological:  Negative for dizziness, syncope, weakness, numbness and headaches.  Psychiatric/Behavioral:  Negative for confusion.     Physical Exam Updated Vital Signs BP 138/66 (BP Location: Right Arm)   Pulse (!) 58   Temp 98 F (36.7 C) (Oral)   Resp 18   Ht '5\' 11"'$  (1.803 m)   Wt 97.5 kg   SpO2 96%   BMI 29.99 kg/m  Physical Exam Vitals and nursing note reviewed.  Constitutional:      General: He is not in acute distress.    Appearance: Normal appearance. He is not ill-appearing or toxic-appearing.  Cardiovascular:     Rate and Rhythm: Normal rate and regular rhythm.     Pulses: Normal pulses.  Pulmonary:     Effort: Pulmonary effort is normal.     Breath sounds: Normal breath sounds.  Chest:     Chest wall: No tenderness.  Abdominal:     General: There is no distension.     Palpations: Abdomen is soft.     Tenderness: There is no abdominal tenderness.  Musculoskeletal:        General: Tenderness present. No signs of injury.     Lumbar back: Tenderness present. No swelling, deformity or signs of trauma. Normal range of motion. Positive left straight leg raise test. Negative right straight leg raise test.     Right lower leg: No edema.     Left lower leg: No edema.     Comments: Tenderness palpation of the lower left lumbar  paraspinal muscles.  There is tenderness over the left SI joint space.  Positive straight leg raise on the right.  No pain with internal and external rotation of the bilateral hips  Skin:    General: Skin is warm.     Capillary Refill: Capillary refill takes less than 2 seconds.     Findings: No rash.  Neurological:     General: No focal deficit present.     Mental Status: He is alert.     Sensory: No sensory deficit.     Motor:  No weakness.     ED Results / Procedures / Treatments   Labs (all labs ordered are listed, but only abnormal results are displayed) Labs Reviewed  CBC WITH DIFFERENTIAL/PLATELET - Abnormal; Notable for the following components:      Result Value   Hemoglobin 12.4 (*)    HCT 38.4 (*)    All other components within normal limits  COMPREHENSIVE METABOLIC PANEL - Abnormal; Notable for the following components:   Glucose, Bld 174 (*)    BUN 30 (*)    Creatinine, Ser 1.44 (*)    Calcium 8.7 (*)    Total Protein 6.3 (*)    GFR, Estimated 51 (*)    All other components within normal limits  URINALYSIS, ROUTINE W REFLEX MICROSCOPIC - Abnormal; Notable for the following components:   APPearance HAZY (*)    Leukocytes,Ua TRACE (*)    Bacteria, UA RARE (*)    All other components within normal limits    EKG None  Radiology MR LUMBAR SPINE WO CONTRAST  Result Date: 04/19/2022 CLINICAL DATA:  Low back and left hip pain for the last couple of weeks. No known injury or prior relevant surgery. EXAM: MRI LUMBAR SPINE WITHOUT CONTRAST TECHNIQUE: Multiplanar, multisequence MR imaging of the lumbar spine was performed. No intravenous contrast was administered. COMPARISON:  CT lumbar spine same date.  Lumbar MRI 10/06/2020. FINDINGS: Segmentation: Conventional anatomy assumed, with the last open disc space designated L5-S1.Concordant with prior imaging. Alignment: Stable mild scoliosis with grade 1 retrolisthesis at L2-3, L3-4 and L5-S1. Grade 1 anterolisthesis at L4-5.  Vertebrae: No worrisome osseous lesion, acute fracture or pars defect. Multilevel chronic endplate degenerative changes which have progressed at L2-3, asymmetric to the left. The visualized sacroiliac joints appear unremarkable. Conus medullaris: Extends to the L1-2 level and appears normal. Paraspinal and other soft tissues: No significant paraspinal findings. A simple appearing right renal cyst has mildly enlarged, measuring up to 5.4 cm. No follow-up imaging recommended. Disc levels: No significant disc space findings from T11-12 through L1-2. L2-3: Compared with previous MRI, progressive loss of disc height with annular disc bulging and endplate osteophytes asymmetric to the left. Left lateral recess and left foraminal narrowing has mildly progressed. No significant central spinal canal stenosis. L3-4: Stable chronic degenerative disc disease with loss of disc height, annular disc bulging, endplate osteophytes, facet and ligamentous hypertrophy. Stable mild spinal stenosis with lateral recess narrowing bilaterally. Stable moderate foraminal narrowing bilaterally. L4-5: Severe multifactorial spinal stenosis secondary to chronic loss of disc height, annular disc bulging, endplate osteophytes and a resulting grade 1 anterolisthesis. There is advanced facet hypertrophy resulting in moderate lateral recess and foraminal narrowing bilaterally. L5-S1: Stable chronic degenerative disc disease with loss of disc height, annular disc bulging and endplate osteophytes. Mild facet and ligamentous hypertrophy. Stable chronic moderate foraminal and mild lateral recess narrowing bilaterally. IMPRESSION: 1. Multilevel spondylosis which has mildly progressed at L2-3 compared with previous MRI of 10/06/2020. Mild left lateral recess and left foraminal narrowing at that level. 2. The additional levels have not significantly changed. There is severe multifactorial spinal stenosis at L4-5 with chronic moderate foraminal narrowing  bilaterally from L3-4 through L5-S1. 3. No acute findings. Electronically Signed   By: Richardean Sale M.D.   On: 04/19/2022 15:38   CT Lumbar Spine Wo Contrast  Result Date: 04/19/2022 CLINICAL DATA:  Low back pain, increased fracture risk EXAM: CT LUMBAR SPINE WITHOUT CONTRAST TECHNIQUE: Multidetector CT imaging of the lumbar spine was performed without intravenous contrast  administration. Multiplanar CT image reconstructions were also generated. RADIATION DOSE REDUCTION: This exam was performed according to the departmental dose-optimization program which includes automated exposure control, adjustment of the mA and/or kV according to patient size and/or use of iterative reconstruction technique. COMPARISON:  MRI 10/06/2020. FINDINGS: Segmentation: 5 lumbar type vertebrae. Alignment: Unchanged mild degenerative retrolisthesis at L2-L3 and L3-L4 and anterolisthesis at L4-L5. Vertebrae: There is no acute lumbar spine fracture. There are extensive sclerotic degenerative endplate changes from L2 through S1. Periarticular sclerosis along the facet joints as well. Paraspinal and other soft tissues: Aortoiliac atherosclerosis. No AAA. No lymphadenopathy. Unchanged simple right renal cyst. Disc levels: T12-L1: No significant spinal canal or neural foraminal narrowing. L1-L2: No significant spinal canal or neural foraminal narrowing. L2-L3: Mild retrolisthesis with endplate spurring and disc bulging. Bilateral facet arthropathy and ligamentum flavum hypertrophy. There is mild spinal canal stenosis, and moderate left neural foraminal stenosis. Mild right neural foraminal narrowing. L3-L4: Severe disc height loss with endplate spurring, retrolisthesis residual disc bulging, ligamentum flavum hypertrophy and bilateral facet arthropathy. There is mild spinal canal stenosis, moderate left and moderate-severe right neural foraminal stenosis. L4-L5: Severe right greater than left disc height loss with disc bulging and  endplate spurring. Ligamentum flavum hypertrophy and severe bilateral facet arthropathy. Severe spinal canal stenosis, moderate left and moderate-severe right neural foraminal stenosis. L5-S1: Severe disc height loss with endplate spurring and severe bilateral facet arthropathy. Bilateral subarticular narrowing. Moderate-severe bilateral neural foraminal stenosis. IMPRESSION: Advanced degenerative changes of the lumbar spine, worst at L4-L5, where there is severe spinal canal stenosis, moderate left and moderate-severe right neural foraminal stenosis. Mild spinal canal stenosis, moderate left and mild right neural foraminal stenosis at L2-L3. Mild spinal canal stenosis, moderate left and moderate-severe right neural foraminal stenosis at L3-L4. Moderate to severe bilateral neural foraminal stenosis at L5-S1. No acute lumbar spine fracture. Electronically Signed   By: Maurine Simmering M.D.   On: 04/19/2022 14:27   CT Hip Left Wo Contrast  Result Date: 04/19/2022 CLINICAL DATA:  Hip pain, chronic, osteoarthritis suspected EXAM: CT OF THE LEFT HIP WITHOUT CONTRAST TECHNIQUE: Multidetector CT imaging of the left hip was performed according to the standard protocol. Multiplanar CT image reconstructions were also generated. RADIATION DOSE REDUCTION: This exam was performed according to the departmental dose-optimization program which includes automated exposure control, adjustment of the mA and/or kV according to patient size and/or use of iterative reconstruction technique. COMPARISON:  None Available. FINDINGS: Bones/Joint/Cartilage There is no evidence of acute fracture. Alignment is normal. There is moderate left hip osteoarthritis. Small cam deformity. No joint effusion. Enthesophyte formation at the greater trochanter. Mild degenerative changes of the symphysis pubis and left SI joint. No focal bone lesion. Lower lumbar spine degenerative disc disease and facet arthropathy, see separately dictated lumbar spine CT.  Ligaments Suboptimally assessed by CT. Muscles and Tendons No acute myotendinous abnormality by CT. No intramuscular collection. Soft tissues No focal fluid collection. Fat containing left inguinal hernia. Enlarged prostate gland. Mild atherosclerosis. IMPRESSION: No evidence of acute fracture. Moderate osteoarthritis of the left hip. Electronically Signed   By: Maurine Simmering M.D.   On: 04/19/2022 14:16    Procedures Procedures    Medications Ordered in ED Medications - No data to display  ED Course/ Medical Decision Making/ A&P                           Medical Decision Making Patient here with acute on chronic  left-sided low back and hip pain, worse x2 weeks.  Pain intermittently radiating into his left leg.  No recent injury.  On exam, patient well-appearing nontoxic.  Ambulatory with slightly antalgic gait.  No urine or bowel retention/incontinence.  Amount and/or Complexity of Data Reviewed Labs: ordered.    Details: Labs show no evidence of leukocytosis, chemistries show slightly worsening kidney function.  Serum creatinine today 1.44 it was 1.3 in May BUN elevated today at 30 it was 24 two months ago.  Patient does not clinically appear dehydrated.  Urinalysis shows trace leukocytes, 6-10 white cells and rare bacteria.  Patient does not have any dysuria symptoms.  Urine culture pending. Radiology: ordered.    Details: CT of the hip shows no evidence of acute fracture there is moderate osteoarthritis of the left hip.  CT L-spine shows advanced degenerative changes of the lumbar spine worse at L4-5 with severe spinal canal stenosis there is mild stenosis also present at L2-3.  MRI of the lumbar spine was ordered for further evaluation showing multi level spondylosis which has progressed at L2-3 compared to January 2022 there is severe multifactorial spinal gnosis at L4 and 5 with chronic moderate foraminal narrowing bilaterally from L3-4 through 5 S1 Discussion of management or test  interpretation with external provider(s): Patient here with low back pain, work-up reassuring.  No cauda equina symptoms   Discussed MRI findings with neurosurgery, Meyran NP, who recommends Medrol Dosepak and will see in their office for close follow-up.  Patient agreeable to plan.  Appears appropriate for discharge home, questions were answered.  Risk Prescription drug management.           Final Clinical Impression(s) / ED Diagnoses Final diagnoses:  Acute left-sided low back pain with left-sided sciatica    Rx / DC Orders ED Discharge Orders     None         Kem Parkinson, PA-C 04/22/22 1334    Kommor, Chance, MD 04/24/22 [phone]

## 2022-04-19 NOTE — ED Triage Notes (Signed)
Pt c/o lower back pain and left hip pain for the last couple of weeks;   Pt denies any urinary sx, bowel changes or denies any injury

## 2022-05-08 ENCOUNTER — Telehealth: Payer: Self-pay | Admitting: *Deleted

## 2022-05-08 NOTE — Telephone Encounter (Signed)
Received call on VM from [place] with [place] [phone] X [phone] for this patient.  Attempted to reach her back to advise that we received a referrral but it was for NeuroSurgery and that would be best to be routed to [facility] and not [place].  Advised that she call back if in fact they still need Neuro Oncology.

## 2022-06-28 IMAGING — MR MR LUMBAR SPINE W/O CM
5 series · 30 of 48 positions shown · non-contrast
Comparison: None.

CLINICAL DATA: Lower back pain

EXAM:
MRI LUMBAR SPINE WITHOUT CONTRAST
TECHNIQUE: Multiplanar, multisequence MR imaging of the lumbar spine was
performed. No intravenous contrast was administered.

[Series 5: T2 · sagittal · 4.0mm · 0.68mm/px · 6 of 17 slices shown (1 of 2)]
[im 1/17]
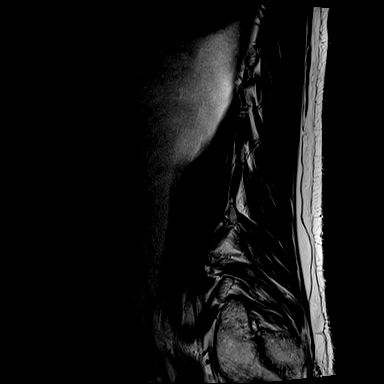
[im 4/17]
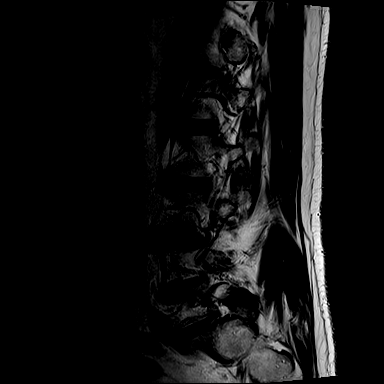
[im 7/17]
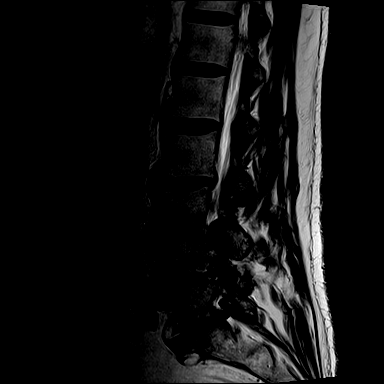
[im 10/17]
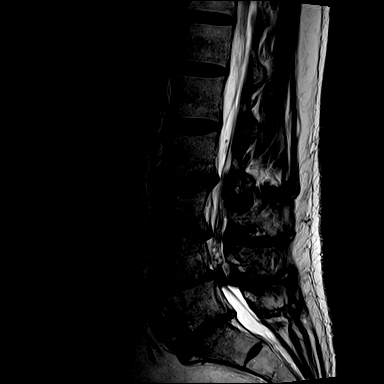
[im 13/17]
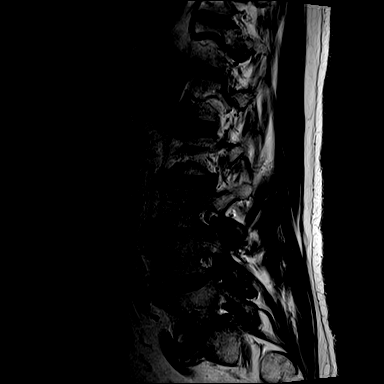
[im 17/17]
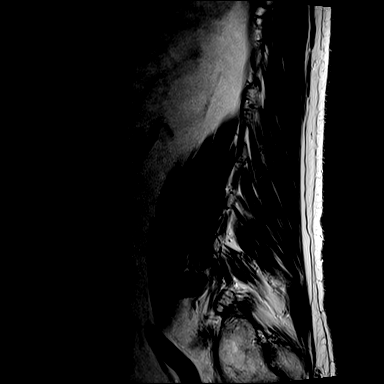

[Series 6: T1 · sagittal · 4.0mm · 0.81mm/px · 7 of 17 slices shown (1 of 2)]
[im 1/17]
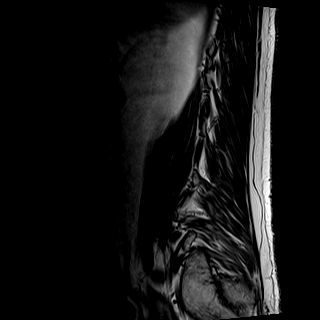
[im 3/17]
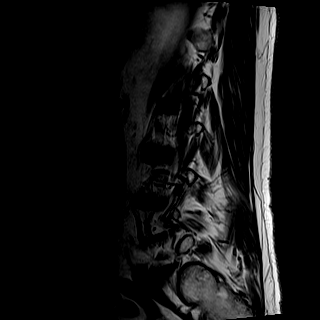
[im 6/17]
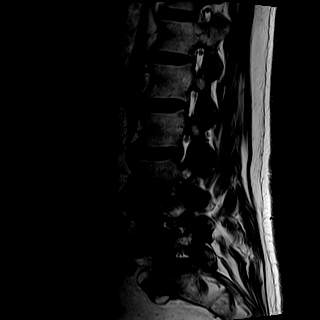
[im 9/17]
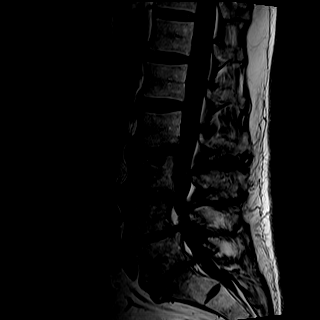
[im 11/17]
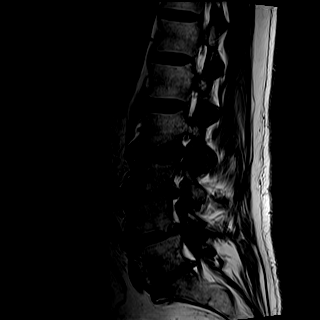
[im 14/17]
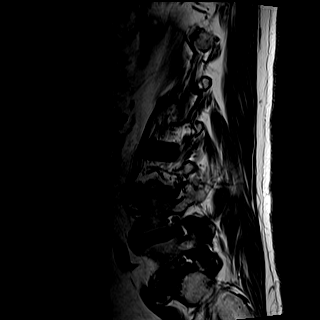
[im 17/17]
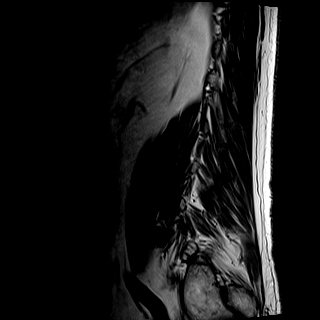

[Series 7: STIR · sagittal · 4.0mm · 0.51mm/px · 1 of 17 slices shown]
[im 1/17]
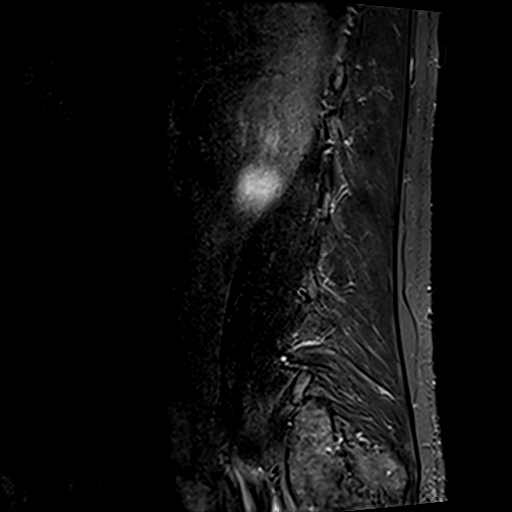

[Series 8: T2 · axial · 4.0mm · 0.70mm/px · z∈[-51,+142]mm · 8 of 33 slices shown (2 of 2)]
[im 1/33]
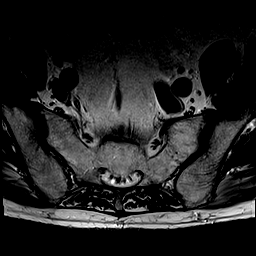
[im 5/33]
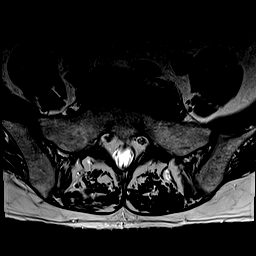
[im 10/33]
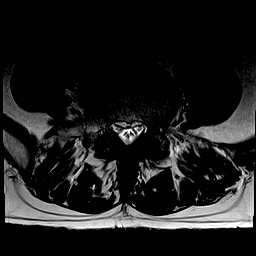
[im 15/33]
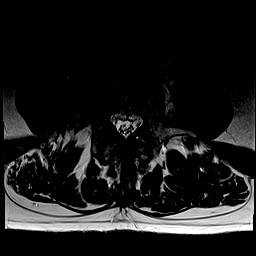
[im 18/33]
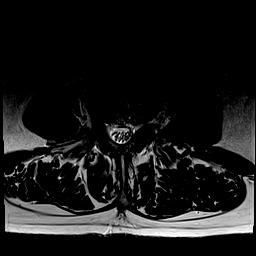
[im 23/33]
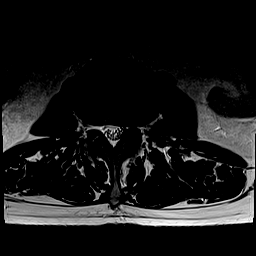
[im 28/33]
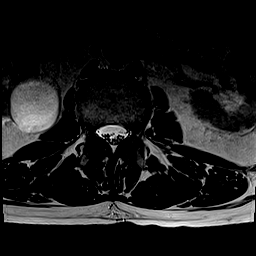
[im 33/33]
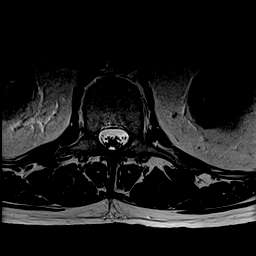

[Series 9: T1 · axial · 4.0mm · 0.35mm/px · z∈[-51,+142]mm · 8 of 33 slices shown (2 of 2)]
[im 1/33]
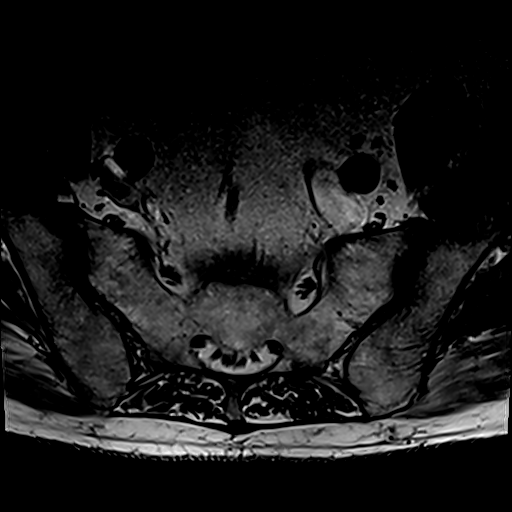
[im 5/33]
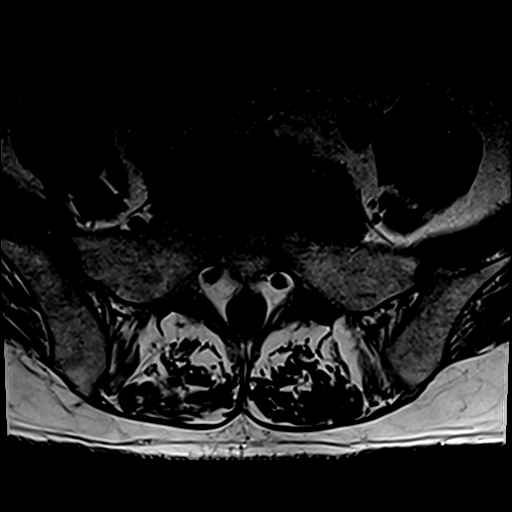
[im 10/33]
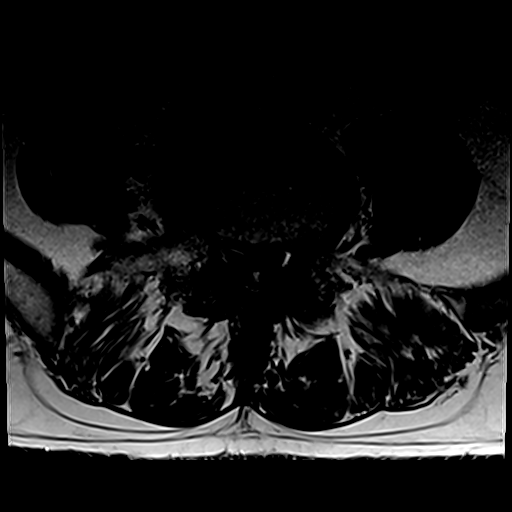
[im 15/33]
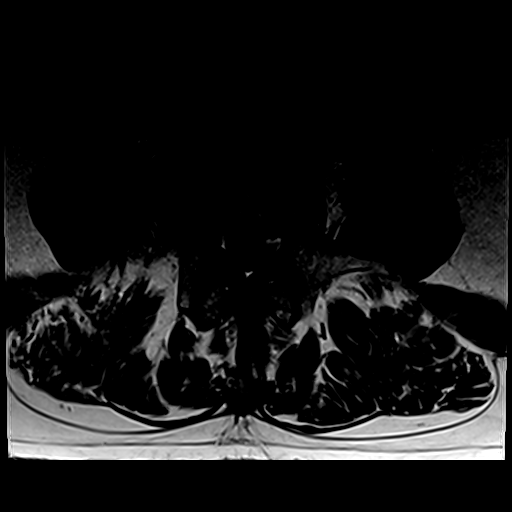
[im 18/33]
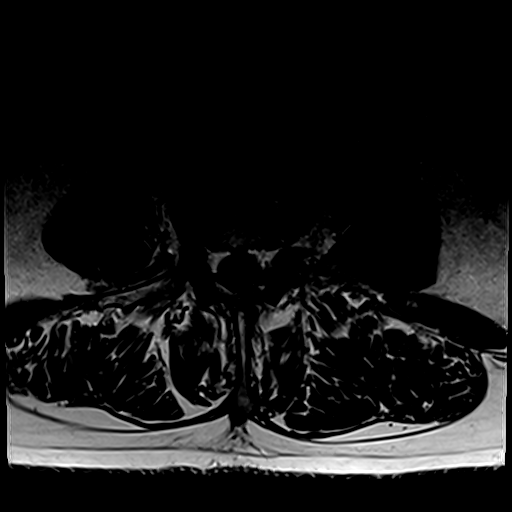
[im 23/33]
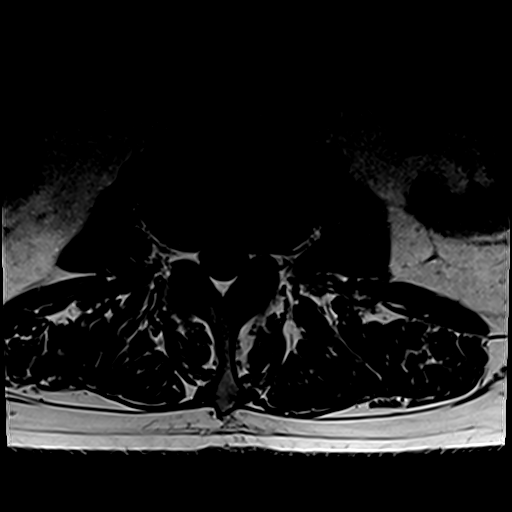
[im 28/33]
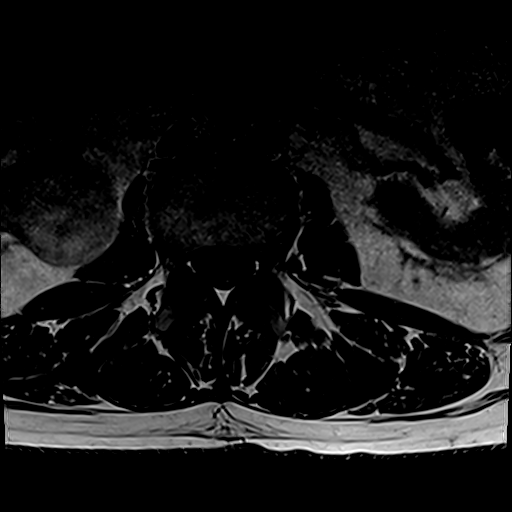
[im 33/33]
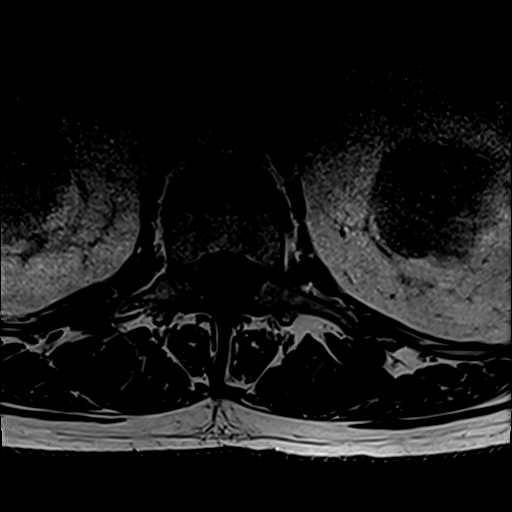

[30 of 48 positions shown; findings below may reference images not displayed]

FINDINGS: Segmentation:  Standard.

Alignment: Levoconvex scoliosis of the lumbar spine. Small
retrolisthesis at L2-3 and L3-4. Small anterolisthesis at L4-5.

Vertebrae: No fracture, evidence of discitis, or bone lesion.
Prominent endplate degenerative changes and loss of disc height at
L3-4, L4-5 and L5-S1 and less pronounced at L2-3.

Conus medullaris and cauda equina: Conus extends to the L1-2 level.
Conus and cauda equina appear normal.

Paraspinal and other soft tissues: A 4 cm right renal cyst.

Disc levels:

T12-L1: No spinal canal or neural foraminal stenosis.

L1-2: No spinal canal or neural foraminal stenosis.

L2-3: Left asymmetric disc bulge and mild facet degenerative changes
resulting mild narrowing of the left subarticular zone and
mild-to-moderate left neural foraminal neck. No significant spinal
canal stenosis.

L3-4: Disc bulge with associated osteophytic component, moderate
facet degenerative change and ligamentum flavum redundancy resulting
in mild spinal canal stenosis with narrowing of the bilateral
subarticular zones, moderate to severe right and moderate left
neural foraminal narrowing.

L4-5: Disc bulge/disc uncovering, prominent hypertrophic facet
degenerative changes and ligamentum flavum redundancy resulting in
severe spinal canal stenosis and mild-to-moderate bilateral neural
foraminal narrowing.

L5-S1: Disc bulge with associated osteophytic component and advanced
facet degenerative changes, left greater than right, resulting in
narrowing of the bilateral subarticular zones and moderate bilateral
neural foraminal narrowing. No significant spinal canal stenosis.
IMPRESSION: 1. Multilevel degenerative changes of the lumbar spine as described
above, with severe spinal canal stenosis at L4-L5 and mild spinal
canal stenosis at L3-L4.
2. Multilevel subarticular zone narrowing with moderate to severe
right and moderate left neural foraminal narrowing at L3-4.
3. Moderate bilateral neural foraminal narrowing at L5-S1.

## 2022-11-22 ENCOUNTER — Other Ambulatory Visit (HOSPITAL_COMMUNITY): Payer: Self-pay | Admitting: Family Medicine

## 2022-11-22 DIAGNOSIS — R911 Solitary pulmonary nodule: Secondary | ICD-10-CM

## 2023-01-17 ENCOUNTER — Encounter (HOSPITAL_COMMUNITY): Payer: Self-pay

## 2023-01-17 ENCOUNTER — Ambulatory Visit (HOSPITAL_COMMUNITY): Admission: RE | Admit: 2023-01-17 | Payer: No Typology Code available for payment source | Source: Ambulatory Visit

## 2023-03-19 DIAGNOSIS — H10013 Acute follicular conjunctivitis, bilateral: Secondary | ICD-10-CM | POA: Diagnosis not present

## 2023-05-09 ENCOUNTER — Other Ambulatory Visit (HOSPITAL_COMMUNITY): Payer: Self-pay | Admitting: Neurosurgery

## 2023-05-09 ENCOUNTER — Ambulatory Visit (HOSPITAL_COMMUNITY)
Admission: RE | Admit: 2023-05-09 | Discharge: 2023-05-09 | Disposition: A | Discharge status: Home / Self Care | Payer: No Typology Code available for payment source | Source: Ambulatory Visit | Attending: Neurosurgery | Admitting: Neurosurgery

## 2023-05-09 DIAGNOSIS — M48062 Spinal stenosis, lumbar region with neurogenic claudication: Secondary | ICD-10-CM | POA: Insufficient documentation

## 2024-03-18 ENCOUNTER — Emergency Department (HOSPITAL_COMMUNITY)
Admission: EM | Admit: 2024-03-18 | Discharge: 2024-03-18 | Disposition: A | Discharge status: Home / Self Care | Source: Ambulatory Visit | Attending: Emergency Medicine | Admitting: Emergency Medicine

## 2024-03-18 ENCOUNTER — Other Ambulatory Visit: Payer: Self-pay

## 2024-03-18 ENCOUNTER — Emergency Department (HOSPITAL_COMMUNITY)

## 2024-03-18 ENCOUNTER — Encounter (HOSPITAL_COMMUNITY): Payer: Self-pay

## 2024-03-18 DIAGNOSIS — Z79899 Other long term (current) drug therapy: Secondary | ICD-10-CM | POA: Insufficient documentation

## 2024-03-18 DIAGNOSIS — Z7982 Long term (current) use of aspirin: Secondary | ICD-10-CM | POA: Diagnosis not present

## 2024-03-18 DIAGNOSIS — E119 Type 2 diabetes mellitus without complications: Secondary | ICD-10-CM | POA: Diagnosis not present

## 2024-03-18 DIAGNOSIS — B3749 Other urogenital candidiasis: Secondary | ICD-10-CM | POA: Insufficient documentation

## 2024-03-18 DIAGNOSIS — I10 Essential (primary) hypertension: Secondary | ICD-10-CM | POA: Insufficient documentation

## 2024-03-18 DIAGNOSIS — Z7984 Long term (current) use of oral hypoglycemic drugs: Secondary | ICD-10-CM | POA: Diagnosis not present

## 2024-03-18 DIAGNOSIS — N50811 Right testicular pain: Secondary | ICD-10-CM | POA: Diagnosis present

## 2024-03-18 LAB — URINALYSIS, ROUTINE W REFLEX MICROSCOPIC
Bacteria, UA: NONE SEEN
Bilirubin Urine: NEGATIVE
Glucose, UA: >=500 mg/dL — AB
Hgb urine dipstick: NEGATIVE
Ketones, ur: NEGATIVE mg/dL
Leukocytes,Ua: NEGATIVE
Nitrite: NEGATIVE
Protein, ur: NEGATIVE mg/dL
Specific Gravity, Urine: 1.023 (ref 1.005–1.030)
pH: 5 (ref 5.0–8.0)

## 2024-03-18 MED ORDER — CLOTRIMAZOLE 1 % EX CREA: TOPICAL_CREAM | CUTANEOUS | 0 refills | Status: AC

## 2024-03-18 NOTE — ED Triage Notes (Signed)
 Pt arrived via POV c/o bilateral testicular pain that Pt reports began when he went to lay down in bed about a week ago. Pt reports trying to massage them w/o relief.

## 2024-03-18 NOTE — Discharge Instructions (Signed)
 Clean the affected areas gently with mild soap and water it is very important that you dry the skin completely before applying the cream.  Use as directed.  Avoid scrubbing the skin.  You may also apply this to your hands.  You may also apply a small amount of 1% hydrocortisone cream to your scrotum 1-2 times a day if needed for itching.  Try to keep the area dry as possible.  Follow-up with your primary care provider for recheck

## 2024-03-21 NOTE — ED Provider Notes (Signed)
 [place] EMERGENCY DEPARTMENT AT [facility] Provider Note   CSN: [id number] Arrival date & time: 03/18/24  1207     Patient presents with: Testicle Pain   Mark Avila is a 78 y.o. male.    Testicle Pain Pertinent negatives include no chest pain and no shortness of breath.        Mark Avila is a 78 y.o. male past medical history of hypertension, type 2 diabetes, and thyroid disease who presents to the Emergency Department complaining of bilateral testicle pain.  Symptoms have been present for 1 week.  He states any movement or palpation to his testicles or scrotum causes pain.  He has noticed redness to the skin of his scrotum as well.  He denies any changes to urine or bowel habits.  No pain with urination fever or chills.  Also denies any abdominal pain or back pain, penile discharge      Prior to Admission medications   Medication Sig Start Date End Date Taking? Authorizing Provider  clotrimazole  (LOTRIMIN ) 1 % cream Apply to affected areas 2 times daily 03/18/24  Yes Kaylynn Chamblin, PA-C  Alogliptin Benzoate 25 MG TABS Take 25 mg by mouth daily.    [provider]  amLODipine (NORVASC) 10 MG tablet Take 10 mg by mouth daily.    [provider]  aspirin EC 325 MG tablet Take 1 tablet (325 mg total) by mouth every morning. 08/24/19   Rehman, Claudis PENNER, MD  carboxymethylcellulose 1 % ophthalmic solution Place 1 drop into both eyes every evening.     [provider]  carvedilol (COREG) 6.25 MG tablet Take 3.125 mg by mouth 2 (two) times daily with a meal.    [provider]  Cholecalciferol (VITAMIN D PO) Take 5,000 Units by mouth daily.     [provider]  famotidine (PEPCID) 20 MG tablet Take 20 mg by mouth 2 (two) times daily.    [provider]  gabapentin (NEURONTIN) 400 MG capsule Take 400 mg by mouth 3 (three) times daily as needed (neuropathy).    [provider]  glipiZIDE (GLUCOTROL) 5 MG tablet  Take 5 mg by mouth 2 (two) times daily before a meal.    [provider]  hydrochlorothiazide (HYDRODIURIL) 25 MG tablet Take 12.5 mg by mouth daily.    [provider]  hydrocortisone 2.5 % cream Apply 1 application topically 2 (two) times daily.    [provider]  ketoconazole (NIZORAL) 2 % cream Apply 1 application topically daily as needed for irritation.    [provider]  levothyroxine (SYNTHROID, LEVOTHROID) 88 MCG tablet Take 88 mcg by mouth daily before breakfast.     [provider]  lidocaine  (LIDODERM ) 5 % Place 1 patch onto the skin daily. Remove & Discard patch within 12 hours or as directed by MD 04/19/22   Tramell Piechota, PA-C  magnesium oxide (MAG-OX) 400 MG tablet Take 400 mg by mouth daily.    [provider]  metFORMIN (GLUCOPHAGE) 1000 MG tablet Take 1,000 mg by mouth 2 (two) times daily with a meal.     [provider]  methylPREDNISolone  (MEDROL  DOSEPAK) 4 MG TBPK tablet Take 6 tablets day one, 5 tablets day two, 4 tablets day three, 3 tablets day four, 2 tablets day five, then 1 tablet day six 04/19/22   Ajani Schnieders, PA-C  Multiple Vitamins-Minerals (PRESERVISION AREDS 2+MULTI VIT PO) Take 1 tablet by mouth 2 (two) times daily.  [provider]  pantoprazole (PROTONIX) 40 MG tablet Take 40 mg by mouth 2 (two) times daily.    [provider]  prednisoLONE acetate (PRED FORTE) 1 % ophthalmic suspension Place 1 drop into both eyes daily.     [provider]  simvastatin (ZOCOR) 40 MG tablet Take 20 mg by mouth every evening.    [provider]  spironolactone (ALDACTONE) 25 MG tablet Take 25 mg by mouth every morning.    [provider]  traMADol (ULTRAM) 50 MG tablet Take 100 mg by mouth 2 (two) times daily.     [provider]  triamcinolone cream (KENALOG) 0.1 % Apply 1 application topically 2 (two) times daily.    [provider]    Allergies:  Patient has no known allergies.    Review of Systems  Respiratory:  Negative for shortness of breath.   Cardiovascular:  Negative for chest pain.  Genitourinary:  Positive for testicular pain. Negative for difficulty urinating, dysuria, flank pain, frequency, genital sores, penile discharge, penile pain, penile swelling and scrotal swelling.  Neurological:  Negative for weakness and numbness.    Updated Vital Signs BP (!) 143/77 (BP Location: Right Arm)   Pulse 63   Temp 98 F (36.7 C)   Resp 18   Ht 6' (1.829 m)   Wt 97.5 kg   SpO2 97%   BMI 29.16 kg/m   Physical Exam Vitals and nursing note reviewed. Exam conducted with a chaperone present.  Constitutional:      General: He is not in acute distress.    Appearance: Normal appearance. He is not toxic-appearing.  Cardiovascular:     Rate and Rhythm: Normal rate and regular rhythm.     Pulses: Normal pulses.  Pulmonary:     Effort: Pulmonary effort is normal.  Abdominal:     Palpations: Abdomen is soft.     Tenderness: There is no abdominal tenderness. There is no right CVA tenderness or left CVA tenderness.  Genitourinary:    Penis: No phimosis, paraphimosis, erythema or discharge.      Testes:        Right: Tenderness present. Mass not present.        Left: Tenderness present. Mass not present.     Comments: Exam chaperoned by nurse, erythema of the hand to the scrotum some cracking of the skin also present.  No bleeding or edema.  Bilateral testicles and skin to the scrotum is painful to touch.  No palpable masses or inguinal adenopathy.  No edema of the scrotum no erythema or edema to the penis Lymphadenopathy:     Lower Body: No right inguinal adenopathy. No left inguinal adenopathy.  Skin:    General: Skin is warm.  Neurological:     General: No focal deficit present.     Mental Status: He is alert.     Sensory: No sensory deficit.     Motor: No weakness.     (all labs ordered are listed, but only abnormal  results are displayed) Labs Reviewed  URINALYSIS, ROUTINE W REFLEX MICROSCOPIC - Abnormal; Notable for the following components:      Result Value   Glucose, UA >=500 (*)    All other components within normal limits    EKG: None  Radiology: No results found.   Procedures   Medications Ordered in the ED - No data to display  Medical Decision Making Patient here with bilateral testicle pain and scrotal pain.  Has been present for 1 week.  Denies any pain redness or swelling of his penis and no dysuria symptoms.  No abdominal pain nausea fever or or vomiting.  On exam, patient does have confluent erythema to the skin of the scrotum there is some mild cracking of the skin as well.  He has a similar appearing rash to the dorsal surface of his bilateral hands.  He has been applying over-the-counter topical agents to his hands without relief.  I suspect this is contact dermatitis versus Candida.  He does have tenderness with movement of his testicles so will obtain ultrasound as well  Amount and/or Complexity of Data Reviewed Labs: ordered.    Details: Urinalysis without evidence of infection Radiology: ordered.    Details: Ultrasound of testicles with Doppler shows no evidence of acute abnormality Discussion of management or test interpretation with external provider(s): Suspect Candida.  Discussed proper hygiene and keeping area clean and dry.  Prescription for Lotrimin  cream and I have also advised him to use 1% hydrocortisone cream.  He will follow-up closely outpatient with PCP if needed.        Final diagnoses:  Candida infection of genital region    ED Discharge Orders          Ordered    clotrimazole  (LOTRIMIN ) 1 % cream        03/18/24 1527               Herlinda Milling, PA-C 03/21/24 1418    Bernard Drivers, MD 03/23/24 1816

## 2024-07-30 ENCOUNTER — Encounter (INDEPENDENT_AMBULATORY_CARE_PROVIDER_SITE_OTHER): Payer: Self-pay | Admitting: *Deleted
